# Patient Record
Sex: Female | Born: 1970 | Race: White | Hispanic: No | Marital: Married | Smoking: Current every day smoker
Health system: Southern US, Community
[De-identification: ages and names within clinical notes are randomized; demographics above are authoritative.]

## PROBLEM LIST (undated history)

## (undated) DIAGNOSIS — Z803 Family history of malignant neoplasm of breast: Secondary | ICD-10-CM

## (undated) DIAGNOSIS — F419 Anxiety disorder, unspecified: Secondary | ICD-10-CM

## (undated) DIAGNOSIS — Z808 Family history of malignant neoplasm of other organs or systems: Secondary | ICD-10-CM

## (undated) DIAGNOSIS — C50912 Malignant neoplasm of unspecified site of left female breast: Secondary | ICD-10-CM

## (undated) HISTORY — DX: Family history of malignant neoplasm of other organs or systems: Z80.8

## (undated) HISTORY — DX: Family history of malignant neoplasm of breast: Z80.3

## (undated) NOTE — *Deleted (*Deleted)
[facility]  [address] [place], [place] [address] Phone: [phone]  Fax: [phone]   Clinic Day:  04/10/2020  Referring physician: Duanne Limerick, MD  Chief Complaint: Danielle Bishop is a 48 y.o. female with left breast DCIS s/p bilateral mastectomy who is seen for 3 month assessment.  HPI: The patient was last seen in the medical oncology clinic on 01/13/2020. At that time, she was doing well s/p mastectomy.  Tumor board on 01/20/2020 recommended radiation given her positive margins and consideration of endocrine therapy.  The patient had bilateral breast implants placed on 02/14/2020 by Dr. Arita Miss.  The patient had a CT simulation on 04/08/2020 with Dr. Rushie Chestnut. The plan is to deliver a hypofractionated course of treatment over 3 weeks.  During the interim, ***   Past Medical History:  Diagnosis Date  . Anxiety   . Breast cancer, left [facility])    dx 09/ 2020 by bx with atypical ductal hyperplasia/ papillnoma;   s/p  lefft breast excsional bx 05-21-2019 ,  dx DCIS  . Family history of breast cancer   . Family history of cancer of tongue   . Family history of skin cancer     Past Surgical History:  Procedure Laterality Date  . BREAST RECONSTRUCTION WITH PLACEMENT OF TISSUE EXPANDER AND FLEX HD (ACELLULAR HYDRATED DERMIS) Bilateral 01/01/2020   Procedure: BREAST RECONSTRUCTION WITH PLACEMENT OF TISSUE EXPANDER AND FLEX HD (ACELLULAR HYDRATED DERMIS);  Surgeon: Allena Napoleon, MD;  Location: [facility];  Service: Plastics;  Laterality: Bilateral;  . MASTECTOMY W/ SENTINEL NODE BIOPSY Bilateral 01/01/2020   Procedure: BILATERAL NIPPLE SPARING MASTECTOMY WITH LEFT SENTINEL LYMPH NODE BIOPSY;  Surgeon: Almond Lint, MD;  Location: [facility];  Service: General;  Laterality: Bilateral;  PEC BLOCK  . RADIOACTIVE SEED GUIDED EXCISIONAL BREAST BIOPSY Left 05/21/2019   Procedure: LEFT RADIOACTIVE SEED GUIDED EXCISIONAL  BREAST BIOPSY;  Surgeon: Almond Lint, MD;  Location: [facility];  Service: General;  Laterality: Left;  . RE-EXCISION OF BREAST LUMPECTOMY Left 06/19/2019   Procedure: RE-EXCISION OF LEFT BREAST LUMPECTOMY;  Surgeon: Almond Lint, MD;  Location: [facility];  Service: General;  Laterality: Left;  . REMOVAL OF BILATERAL TISSUE EXPANDERS WITH PLACEMENT OF BILATERAL BREAST IMPLANTS Bilateral 02/14/2020   Procedure: REMOVAL OF BILATERAL TISSUE EXPANDERS WITH PLACEMENT OF BILATERAL BREAST IMPLANTS;  Surgeon: Allena Napoleon, MD;  Location: [facility];  Service: Plastics;  Laterality: Bilateral;  90 min    Family History  Problem Relation Age of Onset  . Stroke Mother   . Breast cancer Maternal Grandmother 50  . Skin cancer Paternal Grandmother   . Skin cancer Paternal Grandfather   . Breast cancer Other   . Tongue cancer Other   . Cancer Other        unknown type  . Breast cancer Other 89  . Breast cancer Other     Social History:  reports that she has been smoking cigarettes. She has a 10.00 pack-year smoking history. She has never used smokeless tobacco. She reports current alcohol use. She reports that she does not use drugs. She smoked 1/2 pack/day x 20 years. She is now smoking 2 cigarettes per day and some days she does not smoke at all.She occasionally drinks alcohol. Sge denies any exposure to radiation or toxins. 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 The patient is alone*** today.  Allergies: No  Known Allergies  Current Medications: Current Outpatient Medications  Medication Sig Dispense Refill  . ALPRAZolam (XANAX) 0.25 MG tablet Take 1 tablet (0.25 mg total) by mouth 2 (two) times daily as needed for anxiety. (Patient not taking: Reported on 02/26/2020) 20 tablet 1  . ondansetron (ZOFRAN) 4 MG tablet Take 1 tablet (4 mg total) by mouth every 8 (eight) hours as needed for nausea or vomiting. (Patient not  taking: Reported on 02/26/2020) 20 tablet 0  . sertraline (ZOLOFT) 50 MG tablet TAKE 1 TABLET(50 MG) BY MOUTH DAILY 90 tablet 0   No current facility-administered medications for this visit.    Review of Systems  Constitutional: Negative for chills, diaphoresis, fever, malaise/fatigue and weight loss (up 4 lbs).       Doing well.  HENT: Negative for congestion, ear discharge, ear pain, hearing loss, nosebleeds, sinus pain, sore throat and tinnitus.   Eyes: Negative for blurred vision.  Respiratory: Negative for cough, hemoptysis, sputum production and shortness of breath.   Cardiovascular: Negative for chest pain, palpitations and leg swelling.  Gastrointestinal: Negative for abdominal pain, blood in stool, constipation, diarrhea, heartburn, melena, nausea and vomiting.  Genitourinary: Negative for dysuria, flank pain, frequency, hematuria and urgency.  Musculoskeletal: Negative for back pain, joint pain, myalgias and neck pain.  Skin: Negative for itching and rash.  Neurological: Negative for dizziness, tingling, tremors, sensory change, weakness and headaches.  Endo/Heme/Allergies: Does not bruise/bleed easily.  Psychiatric/Behavioral: Negative for depression and memory loss. The patient is not nervous/anxious and does not have insomnia.   All other systems reviewed and are negative.  Performance status (ECOG): 0  Vitals There were no vitals taken for this visit.   Physical Exam Vitals and nursing note reviewed.  Constitutional:      General: She is not in acute distress.    Appearance: Normal appearance.     Interventions: Face mask in place.  HENT:     Head: Normocephalic and atraumatic.     Comments: Long dark hair.    Mouth/Throat:     Mouth: Mucous membranes are moist.     Pharynx: Oropharynx is clear.  Eyes:     General: No scleral icterus.    Extraocular Movements: Extraocular movements intact.     Conjunctiva/sclera: Conjunctivae normal.     Pupils: Pupils are  equal, round, and reactive to light.  Cardiovascular:     Rate and Rhythm: Normal rate and regular rhythm.     Pulses: Normal pulses.     Heart sounds: Normal heart sounds. No murmur heard.   Pulmonary:     Effort: Pulmonary effort is normal. No respiratory distress.     Breath sounds: Normal breath sounds. No wheezing or rales.  Chest:     Chest wall: No tenderness.     Comments: Ace bandage in place.  S/p bilateral mastectomy with reconstruction. Abdominal:     General: Bowel sounds are normal. There is no distension.     Palpations: Abdomen is soft. There is no mass.     Tenderness: There is no abdominal tenderness. There is no guarding.  Musculoskeletal:        General: No swelling or tenderness. Normal range of motion.     Cervical back: Normal range of motion and neck supple.  Lymphadenopathy:     Head:     Right side of head: No preauricular, posterior auricular or occipital adenopathy.     Left side of head: No preauricular, posterior auricular or occipital adenopathy.  Cervical: No cervical adenopathy.     Upper Body:     Right upper body: No supraclavicular or axillary adenopathy.     Left upper body: No supraclavicular or axillary adenopathy.     Lower Body: No right inguinal adenopathy. No left inguinal adenopathy.  Skin:    General: Skin is warm and dry.  Neurological:     Mental Status: She is alert and oriented to person, place, and time. Mental status is at baseline.  Psychiatric:        Mood and Affect: Mood normal.        Behavior: Behavior normal.        Thought Content: Thought content normal.        Judgment: Judgment normal.    No visits with results within 3 Day(s) from this visit.  Latest known visit with results is:  Admission on 06/19/2019, Discharged on 06/19/2019  Component Date Value Ref Range Status  . Preg Test, Ur 06/19/2019 NEGATIVE  NEGATIVE Final   Comment:        THE SENSITIVITY OF THIS METHODOLOGY IS >24 mIU/mL   . SURGICAL  PATHOLOGY 06/19/2019    Final-Edited                   Value:SURGICAL PATHOLOGY CASE: [id number] PATIENT: Danielle Bishop Surgical Pathology Report  Clinical History: Left breast cancer (cm)  FINAL MICROSCOPIC DIAGNOSIS:  A. BREAST, LEFT POSTERIOR MARGIN, RE-EXCISION: - Focus of residual ductal carcinoma in situ. - Atypical lobular hyperplasia/adenosis. - Please see comment.  B. BREAST, LEFT INFERIOR MARGIN, RE-EXCISION: - Residual ductal carcinoma in situ. - Please see comment.  COMMENT:  A. The focus of residual DCIS is present at the cauterized black ink. CK5/6 is negative in DCIS.              E-cadherin demonstrates weak staining in atypical lobular hyperplasia.  P63, Calponin and SMM-1 demonstrate the presence of myoepithelium throughout.  B.  Focally, residual DCIS is present at the cauterized black ink. CK5/6 is negative in DCIS.  Intradepartmental consultation (Dr. Berneice Heinrich).  GROSS DESCRIPTION:  A. Received fresh and placed in formalin at 12 PM is a 3 x 2.6 x 1.5 cm portion of tan-yellow soft tiss                         ue, received entirely inked black. Sectioned and entirely submitted in 5 cassettes.  B.  Received fresh and placed in formalin at 12 PM is a 3.1 x 1.5 x 1 cm portion of tan-yellow soft tissue, received entirely inked blue. Sectioned and entirely submitted in 5 cassettes.  (AK 06/19/2019)  Final Diagnosis performed by Consuello Bossier, MD.   Electronically signed 06/24/2019 Technical and / or Professional components performed at [facility]. [facility], [address] [place], [place] [address].  Immunohistochemistry Technical component (if applicable) was performed at [facility]. [address], [place], [place] [address].   IMMUNOHISTOCHEMISTRY DISCLAIMER (if applicable): Some of these immunohistochemical stains may have been developed and the performance characteristics determine by [facility].  Some may not have been cleared or approved by the U.S. Food and Drug Administration. The FDA has determined that such clear                         ance or approval is not necessary. This test is used for clinical purposes. It should not be  regarded as investigational or for research. This laboratory is certified under the Clinical Laboratory Improvement Amendments of 1988 (CLIA-88) as qualified to perform high complexity clinical laboratory testing.  The controls stained appropriately.    Assessment:  Danielle Bishop is a 27 y.o. female  with left breast DCISs/p bilateral nipple sparing mastectomy and reconstruction on 01/01/2020.  Pathology revealed a 6.5 cm area of grade II DCIS, focally involving the anterior margin and <1 mm from the posterior margin.  One sentinel lymph node was negative.  DCIS was ER+ (95%) and PR+ (100%).  Pathologic stage was pTis (DCIS) pN0.  Right breast pathology was benign.  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  Bilateral mammogram with left axillary ultrasoundon 02/14/2019 revealed duct ectasia in the 6-7 o'clock location of the LEFT breast without identifiable intraductal mass. There was a solid 0.7 x 0.5 x 0.7 cm mass in the 7 o'clock location 2 cm from the nipple in the LEFT breast. There was an enlarged LEFT axillary lymph node. There was a single duct spontaneous LEFT nipple discharge.  Bilateral breast MRI on 07/16/2019 revealed postoperative seroma in the central inferior left breast at the site of patient's lumpectomy for DCIS. Non-mass enhancement was seen surrounding the lumpectomy site involving the entire inferior left breast, spanning 4.0 x 5.8 x 4.3 cm. There was suspicious linear enhancement extending from the postoperative seroma into the nipple. There was indeterminate patchy non-mass enhancement extending into the upper inner  quadrant(UIQ)of the left breast (1.3 x 1.2 cm). There were two indeterminate enhancing masses in the upper inner and lower outer right breast. There was no suspicious lymphadenopathy.  Left breast biopsy on02/12/2021revealedfibrocystic changes in the upper inner quadrant(UIQ)and intermediate grade DCISin thelower outer quadrant (LOQ).DCIS wasER+ (95%)and PR+ (100%).  Right breast biopsyon 02/15/2021revealedatypical lobular hyperplasia, findings suggestive of lipoma,and fibrocystic changes in lower outer quadrant(LOQ).  She has a family historyof breast cancer (maternal grandmother age 30).  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  Patient received the Moderna COVID-19 vaccine on 08/16/2019 and 09/18/2019.   Symptomatically, ***  Plan: 1.   Labs today:  CBC with diff, CMP.  2.   Left breast DCIS Initial pathology revealedgrade II DCIS with focally positive margin. Re-excisionpathology revealed persistent + margins (posterior and inferior). She is s/p bilateral mastectomy and reconstruction on 01/01/2020.  Pathology revealed a 6.5 cm area of grade 2 DCIS focally involving the anterior margin and < 1 mm from the posterior margin.  Discussed likely plan for radiation given positive margin.  Discuss rationale for endocrine therapy for stage 0 breast cancer:   NSABP B-24:  1800 patients randomized between tamoxifen vs placebo in patients s/p BCT and radiation.      At 13.6 years, patients who received tamoxifen had a 3.4% reduction on ipsilateral recurrence and a 3.2% reduction in contralateral breast cancer.      10 year risk of invasive cancer in the ipsilateral breast was 4.6% in the tamoxifen group vs 7.3% placebo group.    10 year risk of 5.6% non-invasive ipsilateral breast cancer was 5.6% in the  tamoxifen group vs 7.2% in the placebo group.      10 year risk in the contralateral breast of invasive and non-invasive breast cancer was 4.7% in the tamoxifen group vs 6.9% in the placebo group.  As patient is s/p bilateral mastectomy, discuss limited benefit of adjuvant endocrine therapy.  Present patient at tumor board. 2. Family history of breast cancer Patient 74 with family history of breast cancer in a maternal grandmother at age 48. [employer] results revealed a variant of uncertain significance.   ATM c.7816A>G (p.lle2606Val) and gain (exons 62-63). 3.   Tumor board on 01/20/2020. 4.   MD to call patient after tumor board on 01/20/2020. 5.   RTC in 3 months for MD assessment.  I discussed the assessment and treatment plan with the patient.  The patient was provided an opportunity to ask questions and all were answered.  The patient agreed with the plan and demonstrated an understanding of the instructions.  The patient was advised to call back if the symptoms worsen or if the condition fails to improve as anticipated.  I provided *** minutes of face-to-face time during this this encounter and > 50% was spent counseling as documented under my assessment and plan.  Rosey Bath, MD, PhD    04/10/2020, 10:49 AM  I, Danella Penton Tufford, am acting as Neurosurgeon for [employer]. Merlene Pulling, MD, PhD.  I, Melissa C. Merlene Pulling, MD, have reviewed the above documentation for accuracy and completeness, and I agree with the above.

---

## 2017-04-17 ENCOUNTER — Ambulatory Visit: Payer: 59 | Admitting: Family Medicine

## 2017-04-17 ENCOUNTER — Encounter: Payer: Self-pay | Admitting: Family Medicine

## 2017-04-17 VITALS — BP 120/72 | HR 86 | Ht 62.0 in | Wt 118.0 lb

## 2017-04-17 DIAGNOSIS — Z7689 Persons encountering health services in other specified circumstances: Secondary | ICD-10-CM

## 2017-04-17 DIAGNOSIS — Z720 Tobacco use: Secondary | ICD-10-CM

## 2017-04-17 DIAGNOSIS — Z23 Encounter for immunization: Secondary | ICD-10-CM | POA: Diagnosis not present

## 2017-04-17 MED ORDER — VARENICLINE TARTRATE 0.5 MG PO TABS: 0.5000 mg | ORAL_TABLET | Freq: Two times a day (BID) | ORAL | 0 refills | Status: DC

## 2017-04-17 NOTE — Progress Notes (Signed)
Name: Danielle Bishop   MRN: [id number]    DOB: 1971/06/07   Date:04/17/2017       Progress Note  Subjective  Chief Complaint  Chief Complaint  Patient presents with  . Establish Care    needed PCP    Patient to establish primary care.    No problem-specific Assessment & Plan notes found for this encounter.   History reviewed. No pertinent past medical history.  History reviewed. No pertinent surgical history.  Family History  Problem Relation Age of Onset  . Stroke Mother   . Cancer Maternal Grandmother     Social History   Socioeconomic History  . Marital status: Married    Spouse name: Not on file  . Number of children: Not on file  . Years of education: Not on file  . Highest education level: Not on file  Social Needs  . Financial resource strain: Not on file  . Food insecurity - worry: Not on file  . Food insecurity - inability: Not on file  . Transportation needs - medical: Not on file  . Transportation needs - non-medical: Not on file  Occupational History  . Not on file  Tobacco Use  . Smoking status: Current Every Day Smoker    Types: Cigarettes  . Smokeless tobacco: Never Used  . Tobacco comment: patches and oral meds given info  Substance and Sexual Activity  . Alcohol use: Yes  . Drug use: No  . Sexual activity: Yes  Other Topics Concern  . Not on file  Social History Narrative  . Not on file    No Known Allergies  No outpatient medications prior to visit.   No facility-administered medications prior to visit.     Review of Systems  Constitutional: Negative for chills, diaphoresis, fever, malaise/fatigue and weight loss.  HENT: Negative for congestion, ear discharge, ear pain, hearing loss, nosebleeds, sore throat and tinnitus.   Eyes: Negative for blurred vision, double vision, pain and discharge.  Respiratory: Negative for cough, hemoptysis, sputum production, shortness of breath, wheezing and stridor.   Cardiovascular: Negative for chest  pain, palpitations, orthopnea, leg swelling and PND.  Gastrointestinal: Negative for abdominal pain, blood in stool, constipation, diarrhea, heartburn, melena, nausea and vomiting.  Genitourinary: Negative for dysuria, flank pain, frequency, hematuria and urgency.  Musculoskeletal: Negative for back pain, falls, joint pain, myalgias and neck pain.  Skin: Negative for itching and rash.  Neurological: Negative for dizziness, tingling, sensory change, focal weakness, seizures, loss of consciousness, weakness and headaches.  Endo/Heme/Allergies: Negative for environmental allergies and polydipsia. Does not bruise/bleed easily.  Psychiatric/Behavioral: Negative for depression and suicidal ideas. The patient is nervous/anxious. The patient does not have insomnia.      Objective  Vitals:   04/17/17 1439  BP: 120/72  Pulse: 86  Weight: 118 lb (53.5 kg)  Height: 5\' 2"  (1.575 m)    Physical Exam  Constitutional: She is well-developed, well-nourished, and in no distress. No distress.  HENT:  Head: Normocephalic and atraumatic.  Right Ear: External ear normal.  Left Ear: External ear normal.  Nose: Nose normal.  Mouth/Throat: Oropharynx is clear and moist.  Eyes: Conjunctivae and EOM are normal. Pupils are equal, round, and reactive to light. Right eye exhibits no discharge. Left eye exhibits no discharge.  Neck: Normal range of motion. Neck supple. No JVD present. No thyromegaly present.  Cardiovascular: Normal rate, regular rhythm, normal heart sounds and intact distal pulses. Exam reveals no gallop and no friction rub.  No  murmur heard. Pulmonary/Chest: Effort normal and breath sounds normal. She has no wheezes. She has no rales.  Abdominal: Soft. Bowel sounds are normal. She exhibits no mass. There is no tenderness. There is no guarding.  Musculoskeletal: Normal range of motion. She exhibits no edema.  Lymphadenopathy:    She has no cervical adenopathy.  Neurological: She is alert. She  has normal reflexes.  Skin: Skin is warm and dry. She is not diaphoretic.  Psychiatric: Mood and affect normal.  Nursing note and vitals reviewed.     Assessment & Plan  Problem List Items Addressed This Visit    None    Visit Diagnoses    Establishing care with new doctor, encounter for    -  Primary   Current nicotine use       Influenza vaccine needed       Relevant Orders   Flu Vaccine QUAD 36+ mos IM (Completed)      Meds ordered this encounter  Medications  . varenicline (CHANTIX) 0.5 MG tablet    Sig: Take 1 tablet (0.5 mg total) 2 (two) times daily by mouth.    Dispense:  60 tablet    Refill:  0      Dr. Otilio Miu [facility]  04/17/17

## 2019-02-07 ENCOUNTER — Ambulatory Visit: Payer: 59 | Admitting: Family Medicine

## 2019-02-07 ENCOUNTER — Encounter: Payer: Self-pay | Admitting: Family Medicine

## 2019-02-07 ENCOUNTER — Other Ambulatory Visit: Payer: Self-pay

## 2019-02-07 VITALS — BP 122/68 | HR 80 | Ht 68.0 in | Wt 122.0 lb

## 2019-02-07 DIAGNOSIS — N6452 Nipple discharge: Secondary | ICD-10-CM | POA: Diagnosis not present

## 2019-02-07 DIAGNOSIS — N6012 Diffuse cystic mastopathy of left breast: Secondary | ICD-10-CM | POA: Diagnosis not present

## 2019-02-07 DIAGNOSIS — Z23 Encounter for immunization: Secondary | ICD-10-CM | POA: Diagnosis not present

## 2019-02-07 DIAGNOSIS — N6011 Diffuse cystic mastopathy of right breast: Secondary | ICD-10-CM | POA: Diagnosis not present

## 2019-02-07 NOTE — Patient Instructions (Signed)
Fibrocystic Breast Changes  Fibrocystic breast changes are changes in breast tissue that can cause breasts to become swollen, lumpy, or painful. This can happen due to buildup of scar-like tissue (fibrous tissue) or the forming of fluid-filled lumps (cysts) in the breast. This is a common condition, and it is not cancerous (is benign). The exact cause is not known, but it seems to occur when women go through hormonal changes during their menstrual cycle. Fibrocystic breast changes can affect one or both breasts. What are the causes? The exact cause of fibrocystic breast changes is not known. However, this condition:  May be related to the female hormones estrogen and progesterone.  May be influenced by family traits that get passed from parent to child (genetics). What are the signs or symptoms? Symptoms of this condition may affect one or both breasts, and may include:  Tenderness, mild discomfort, or pain.  Swelling.  Rope-like tissue that can be felt when touching the breast.  Lumps in one or both breasts.  Changes in breast size. Breasts may get larger before the menstrual period and smaller after the menstrual period.  Green or dark brown discharge from the nipple. Symptoms are usually worse before menstrual periods start, and they get better toward the end of menstrual periods. How is this diagnosed? This condition is diagnosed based on your medical history and a physical exam of your breasts. You may also have tests, such as:  A breast X-ray (mammogram).  Ultrasound of your breasts.  MRI.  Removal of a breast tissue sample for testing (breast biopsy). This may be done if your health care provider thinks that something else may be causing changes in your breasts. How is this treated? Often, treatment is not needed for this condition. In some cases, treatment may include:  Taking over-the-counter pain relievers to help lessen pain or discomfort.  Limiting or avoiding  caffeine. Foods and beverages that contain caffeine include chocolate, soda, coffee, and tea.  Reducing sugar and fat in your diet. Your health care provider may also recommend:  A procedure to remove fluid from a cyst that is causing pain (fine needle aspiration).  Surgery to remove a cyst that is large or tender or does not go away. Follow these instructions at home:  Examine your breasts after every menstrual period. If you do not have menstrual periods, check your breasts on the first day of every month. Feel for changes in your breasts, such as: ? More tenderness. ? A new growth. ? A change in size. ? A change in an existing lump.  Take over-the-counter and prescription medicines only as told by your health care provider.  Wear a well-fitted support or sports bra, especially when exercising.  Decrease or avoid caffeine, fat, and sugar in your diet as directed by your health care provider. Contact a health care provider if:  You have fluid leaking from your nipple, especially if it is bloody.  You have new lumps or bumps in your breast.  Your breast becomes enlarged, red, and painful.  You have areas of your breast that pucker inward.  Your nipple appears flat or indented. Get help right away if:  You have redness of your breast and the redness is spreading. Summary  Fibrocystic breast changes are changes in breast tissue that can cause breasts to become swollen, lumpy, or painful.  This condition may be related to the female hormones estrogen and progesterone.  With this condition, it is important to examine your breasts after every   menstrual period. If you do not have menstrual periods, check your breasts on the first day of every month. This information is not intended to replace advice given to you by your health care provider. Make sure you discuss any questions you have with your health care provider. Document Released: 03/16/2006 Document Revised: 05/12/2017  Document Reviewed: 01/27/2016 Elsevier Patient Education  2020 [employer].

## 2019-02-07 NOTE — Progress Notes (Signed)
Date:  02/07/2019   Name:  Danielle Bishop   DOB:  1970/08/30   MRN:  [id number]   Chief Complaint: Breast Discharge (brownish discharge x 8 months- 1 year L) breast) and influenza vacc need  Patient is a 48 year old female who presents for a breast  exam. The patient reports the following problems: nipple discharge. Health maintenance has been reviewed up to date.   Review of Systems  Constitutional: Negative.  Negative for chills, fatigue, fever and unexpected weight change.  HENT: Negative for congestion, ear discharge, ear pain, postnasal drip, rhinorrhea, sinus pressure, sneezing and sore throat.   Eyes: Negative for photophobia, pain, discharge, redness and itching.  Respiratory: Negative for cough, shortness of breath, wheezing and stridor.   Cardiovascular: Negative for chest pain, palpitations and leg swelling.  Gastrointestinal: Negative for abdominal pain, blood in stool, constipation, diarrhea, nausea and vomiting.  Endocrine: Negative for cold intolerance, heat intolerance, polydipsia, polyphagia and polyuria.  Genitourinary: Negative for dysuria, flank pain, frequency, hematuria, menstrual problem, pelvic pain, urgency, vaginal bleeding and vaginal discharge.  Musculoskeletal: Negative for arthralgias, back pain and myalgias.  Skin: Negative for rash.  Allergic/Immunologic: Negative for environmental allergies and food allergies.  Neurological: Negative for dizziness, weakness, light-headedness, numbness and headaches.  Hematological: Negative for adenopathy. Does not bruise/bleed easily.  Psychiatric/Behavioral: Negative for dysphoric mood. The patient is not nervous/anxious.     There are no active problems to display for this patient.   No Known Allergies  No past surgical history on file.  Social History   Tobacco Use  . Smoking status: Current Every Day Smoker    Types: Cigarettes  . Smokeless tobacco: Never Used  . Tobacco comment: patches and oral meds given  info  Substance Use Topics  . Alcohol use: Yes  . Drug use: No     Medication list has been reviewed and updated.  No outpatient medications have been marked as taking for the 02/07/19 encounter (Office Visit) with Juline Patch, MD.    [facility] 2/9 Scores 04/17/2017  PHQ - 2 Score 0  PHQ- 9 Score 1    BP Readings from Last 3 Encounters:  02/07/19 122/68  04/17/17 120/72    Physical Exam Vitals signs and nursing note reviewed.  Constitutional:      Appearance: She is well-developed and normal weight.  HENT:     Head: Normocephalic.     Right Ear: Tympanic membrane, ear canal and external ear normal.     Left Ear: Tympanic membrane, ear canal and external ear normal.     Nose: Nose normal.  Eyes:     General: Lids are everted, no foreign bodies appreciated. No scleral icterus.       Left eye: No foreign body or hordeolum.     Conjunctiva/sclera: Conjunctivae normal.     Right eye: Right conjunctiva is not injected.     Left eye: Left conjunctiva is not injected.     Pupils: Pupils are equal, round, and reactive to light.  Neck:     Musculoskeletal: Normal range of motion and neck supple.     Thyroid: No thyromegaly.     Vascular: No JVD.     Trachea: No tracheal deviation.  Cardiovascular:     Rate and Rhythm: Normal rate and regular rhythm.     Heart sounds: Normal heart sounds. No murmur. No friction rub. No gallop.   Pulmonary:     Effort: Pulmonary effort is normal. No respiratory distress.  Breath sounds: Normal breath sounds. No wheezing or rales.  Chest:     Breasts: Breasts are symmetrical.        Right: No swelling, bleeding, inverted nipple, mass, nipple discharge, skin change or tenderness.        Left: Nipple discharge present. No swelling, bleeding, inverted nipple, mass, skin change or tenderness.     Comments: Breast tissue c/w fibrocystic disease Abdominal:     General: Bowel sounds are normal.     Palpations: Abdomen is soft. There is no mass.      Tenderness: There is no abdominal tenderness. There is no guarding or rebound.  Musculoskeletal: Normal range of motion.        General: No tenderness.  Lymphadenopathy:     Cervical: No cervical adenopathy.  Skin:    General: Skin is warm.     Findings: No rash.  Neurological:     Mental Status: She is alert and oriented to person, place, and time.     Cranial Nerves: No cranial nerve deficit.     Deep Tendon Reflexes: Reflexes normal.  Psychiatric:        Mood and Affect: Mood is not anxious or depressed.     Wt Readings from Last 3 Encounters:  02/07/19 122 lb (55.3 kg)  04/17/17 118 lb (53.5 kg)    BP 122/68   Pulse 80   Ht 5\' 8"  (1.727 m)   Wt 122 lb (55.3 kg)   LMP 02/07/2019 (Exact Date)   BMI 18.55 kg/m   Assessment and Plan:  1. Discharge from left nipple Patient has noted a brownish discharge for the past several months from the left nipple.  Will schedule for mammogram with associated tomos and ultrasound as needed. - MM DIAG BREAST TOMO BILATERAL; Future - US BREAST LTD UNI LEFT INC AXILLA; Future - US BREAST LTD UNI RIGHT INC AXILLA; Future  2. Fibrocystic breast changes of both breasts Patient has an exam consistent with fibrocystic changes in both breasts and this would be noted for future. - MM DIAG BREAST TOMO BILATERAL; Future  3. Influenza vaccine needed Discussed and administered. - Flu Vaccine QUAD 6+ mos PF IM (Fluarix Quad PF)

## 2019-02-14 ENCOUNTER — Ambulatory Visit
Admission: RE | Admit: 2019-02-14 | Discharge: 2019-02-14 | Disposition: A | Discharge status: Home / Self Care | Payer: 59 | Source: Ambulatory Visit | Attending: Family Medicine | Admitting: Family Medicine

## 2019-02-14 ENCOUNTER — Other Ambulatory Visit: Payer: Self-pay | Admitting: Family Medicine

## 2019-02-14 DIAGNOSIS — N6011 Diffuse cystic mastopathy of right breast: Secondary | ICD-10-CM | POA: Diagnosis present

## 2019-02-14 DIAGNOSIS — N6452 Nipple discharge: Secondary | ICD-10-CM

## 2019-02-14 DIAGNOSIS — N6320 Unspecified lump in the left breast, unspecified quadrant: Secondary | ICD-10-CM

## 2019-02-14 DIAGNOSIS — R928 Other abnormal and inconclusive findings on diagnostic imaging of breast: Secondary | ICD-10-CM

## 2019-02-14 DIAGNOSIS — N6012 Diffuse cystic mastopathy of left breast: Secondary | ICD-10-CM | POA: Diagnosis present

## 2019-02-22 ENCOUNTER — Ambulatory Visit
Admission: RE | Admit: 2019-02-22 | Discharge: 2019-02-22 | Disposition: A | Discharge status: Home / Self Care | Payer: 59 | Source: Ambulatory Visit | Attending: Family Medicine | Admitting: Family Medicine

## 2019-02-22 DIAGNOSIS — R928 Other abnormal and inconclusive findings on diagnostic imaging of breast: Secondary | ICD-10-CM | POA: Diagnosis not present

## 2019-02-22 DIAGNOSIS — N6320 Unspecified lump in the left breast, unspecified quadrant: Secondary | ICD-10-CM

## 2019-02-25 LAB — SURGICAL PATHOLOGY

## 2019-03-18 ENCOUNTER — Other Ambulatory Visit: Payer: Self-pay | Admitting: General Surgery

## 2019-03-18 DIAGNOSIS — N6452 Nipple discharge: Secondary | ICD-10-CM

## 2019-04-10 ENCOUNTER — Other Ambulatory Visit: Payer: Self-pay | Admitting: General Surgery

## 2019-04-10 DIAGNOSIS — N6452 Nipple discharge: Secondary | ICD-10-CM

## 2019-05-07 ENCOUNTER — Encounter (HOSPITAL_BASED_OUTPATIENT_CLINIC_OR_DEPARTMENT_OTHER): Payer: Self-pay | Admitting: *Deleted

## 2019-05-07 ENCOUNTER — Other Ambulatory Visit: Payer: Self-pay

## 2019-05-17 ENCOUNTER — Other Ambulatory Visit
Admission: RE | Admit: 2019-05-17 | Discharge: 2019-05-17 | Disposition: A | Discharge status: Home / Self Care | Payer: 59 | Source: Ambulatory Visit | Attending: General Surgery | Admitting: General Surgery

## 2019-05-17 ENCOUNTER — Other Ambulatory Visit (HOSPITAL_COMMUNITY): Payer: 59

## 2019-05-17 DIAGNOSIS — Z20828 Contact with and (suspected) exposure to other viral communicable diseases: Secondary | ICD-10-CM | POA: Diagnosis not present

## 2019-05-17 DIAGNOSIS — Z01812 Encounter for preprocedural laboratory examination: Secondary | ICD-10-CM | POA: Insufficient documentation

## 2019-05-17 LAB — SARS CORONAVIRUS 2 (TAT 6-24 HRS): SARS Coronavirus 2: NEGATIVE

## 2019-05-20 ENCOUNTER — Other Ambulatory Visit: Payer: Self-pay

## 2019-05-20 ENCOUNTER — Ambulatory Visit
Admission: RE | Admit: 2019-05-20 | Discharge: 2019-05-20 | Disposition: A | Discharge status: Home / Self Care | Payer: 59 | Source: Ambulatory Visit | Attending: General Surgery | Admitting: General Surgery

## 2019-05-20 DIAGNOSIS — N6452 Nipple discharge: Secondary | ICD-10-CM

## 2019-05-20 NOTE — H&P (View-Only) (Signed)
Danielle Bishop Location: [place] Office Patient #: [phone] DOB: Oct 17, 1970 Undefined / Language: [name] / Race: White Female   History of Present Illness The patient is a 48 year old female who presents with a complaint of nipple discharge. Danielle Bishop is a 48 yo F referred for consultation at the request of Dr. Otilio Miu for a diagnosis of nipple discharge starting approximately in June of 2020. She noticed this because of occasional brown spots on her shirts/bras. She denies breast pain. She has always had dense breasts. She has family history of breast cancer in her maternal grandmother who was diagnosed around age 55. She underwent diagnostic imaging which showed numerous dilated ducts on the left behind the areola and a 7 mm mass at 7 o'clock around 2 cm from the nipple. 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 she presents to discuss excision. A left sided lymph node was also biopsied and was benign.   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   dx mammogram/us 02/14/2019 COMPARISON: Baseline exam  ACR Breast Density Category d: The breast tissue is extremely dense, which lowers the sensitivity of mammography.  FINDINGS: RIGHT breast is negative.  Asymmetric density is identified in the LOWER INNER QUADRANT of the LEFT breast and further evaluated with spot compression views. These views demonstrate no discrete mass in the [place]. Spot compression view of the retroareolar region of the LEFT breast is unremarkable.  Mammographic images were processed with CAD.  On physical exam, I am able to express a small amount of clear discharge from the central aspect of the LEFT nipple, a single duct. I palpate no abnormality in the LOWER INNER QUADRANT of the LEFT breast.  Targeted ultrasound is performed, showing numerous dilated ducts in the 6-7 o'clock location of the LEFT breast.  No intraductal mass identified. A solid mass with cystic components is identified in the 7 o'clock location of the LEFT breast 2 centimeters from the nipple measuring 0.7 x 0.5 x 0.7 centimeters.  Evaluation of the LEFT axilla shows a single lymph node with thickened cortex. Other lymph nodes have normal morphology.  IMPRESSION: Duct ectasia in the 6-7 o'clock location of the LEFT breast without identifiable intraductal mass.  Solid mass in the 7 o'clock location of the LEFT breast warranting tissue diagnosis.  Enlarged LEFT axillary lymph node warranting biopsy.  Single duct spontaneous LEFT nipple discharge warrants further evaluation.  RECOMMENDATION: 1. Ultrasound-guided core biopsy of mass in the 7 o'clock location of the LEFT breast. 2. Ultrasound-guided core biopsy of enlarged LEFT axillary lymph node. 3. Further evaluation with MRI and surgical consultation will be recommended following biopsies to evaluate single duct spontaneous discharge.  I have discussed the findings and recommendations with the patient. If applicable, a reminder letter will be sent to the patient regarding the next appointment.  BI-RADS CATEGORY 4: Suspicious.   pathology biopsy 02/22/2019   DIAGNOSIS: A. 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  Comment: No invasive carcinoma is seen in this sample.  B. LYMPH NODE, LEFT AXILLA; ULTRASOUND-GUIDED CORE BIOPSY: - LYMPH NODE WITH TATTOO PIGMENT. - NEGATIVE FOR MALIGNANCY.   Past Surgical History Breast Biopsy  Left.  Diagnostic Studies History Colonoscopy  never Mammogram  within last year Pap Smear  >5 years ago  Allergies  No Known Drug Allergies [03/18/2019]:  Medication History No Current Medications Medications Reconciled  Pregnancy / Birth History Age at menarche  50  years. Contraceptive History  Oral contraceptives. Gravida  0 Regular periods   Other Problems No pertinent past medical history     Review of Systems General Present- Night Sweats. Not Present- Appetite Loss, Chills, Fatigue, Fever, Weight Gain and Weight Loss. Skin Not Present- Change in Wart/Mole, Dryness, Hives, Jaundice, New Lesions, Non-Healing Wounds, Rash and Ulcer. HEENT Not Present- Earache, Hearing Loss, Hoarseness, Nose Bleed, Oral Ulcers, Ringing in the Ears, Seasonal Allergies, Sinus Pain, Sore Throat, Visual Disturbances, Wears glasses/contact lenses and Yellow Eyes. Respiratory Present- Snoring. Not Present- Bloody sputum, Chronic Cough, Difficulty Breathing and Wheezing. Breast Present- Nipple Discharge. Not Present- Breast Mass, Breast Pain and Skin Changes. Cardiovascular Not Present- Chest Pain, Difficulty Breathing Lying Down, Leg Cramps, Palpitations, Rapid Heart Rate, Shortness of Breath and Swelling of Extremities. Gastrointestinal Not Present- Abdominal Pain, Bloating, Bloody Stool, Change in Bowel Habits, Chronic diarrhea, Constipation, Difficulty Swallowing, Excessive gas, Gets full quickly at meals, Hemorrhoids, Indigestion, Nausea, Rectal Pain and Vomiting. Female Genitourinary Not Present- Frequency, Nocturia, Painful Urination, Pelvic Pain and Urgency. Musculoskeletal Not Present- Back Pain, Joint Pain, Joint Stiffness, Muscle Pain, Muscle Weakness and Swelling of Extremities. Neurological Not Present- Decreased Memory, Fainting, Headaches, Numbness, Seizures, Tingling, Tremor, Trouble walking and Weakness. Psychiatric Present- Anxiety. Not Present- Bipolar, Change in Sleep Pattern, Depression, Fearful and Frequent crying. Endocrine Not Present- Cold Intolerance, Excessive Hunger, Hair Changes, Heat Intolerance, Hot flashes and New Diabetes. Hematology Not Present- Blood Thinners, Easy Bruising, Excessive bleeding, Gland problems, HIV and Persistent  Infections.  Vitals Weight: 118.25 lb Height: 62.5in Body Surface Area: 1.54 m Body Mass Index: 21.28 kg/m  Temp.: 98.28F(Oral)  Pulse: 101 (Regular)  P.OX: 96% (Room air)       Physical Exam General Mental Status-Alert. General Appearance-Consistent with stated age. Hydration-Well hydrated. Voice-Normal.  Head and Neck Head-normocephalic, atraumatic with no lesions or palpable masses. Trachea-midline. Thyroid Gland Characteristics - normal size and consistency.  Eye Eyeball - Bilateral-Extraocular movements intact. Sclera/Conjunctiva - Bilateral-No scleral icterus.  Chest and Lung Exam Chest and lung exam reveals -quiet, even and easy respiratory effort with no use of accessory muscles and on auscultation, normal breath sounds, no adventitious sounds and normal vocal resonance. Inspection Chest Wall - Normal. Back - normal.  Breast Note: breasts are symmetric bilaterally. no palpable masses. breasts are quite dense. minimal ptosis. bloody nipple discharge is present nearly centrally on the right nipple, maybe slightly lower outer portion. no LAD.   Cardiovascular Cardiovascular examination reveals -normal heart sounds, regular rate and rhythm with no murmurs and normal pedal pulses bilaterally.  Abdomen Inspection Inspection of the abdomen reveals - No Hernias. Palpation/Percussion Palpation and Percussion of the abdomen reveal - Soft, Non Tender, No Rebound tenderness, No Rigidity (guarding) and No hepatosplenomegaly. Auscultation Auscultation of the abdomen reveals - Bowel sounds normal.  Neurologic Neurologic evaluation reveals -alert and oriented x 3 with no impairment of recent or remote memory. Mental Status-Normal.  Musculoskeletal Global Assessment -Note: no gross deformities.  Normal Exam - Left-Upper Extremity Strength Normal and Lower Extremity Strength Normal. Normal Exam - Right-Upper Extremity  Strength Normal and Lower Extremity Strength Normal.  Lymphatic Head & Neck  General Head & Neck Lymphatics: Bilateral - Description - Normal. Axillary  General Axillary Region: Bilateral - Description - Normal. Tenderness - Non Tender. Femoral & Inguinal  Generalized Femoral & Inguinal Lymphatics: Bilateral - Description - No Generalized lymphadenopathy.  Assessment & Plan BLOODY DISCHARGE FROM LEFT NIPPLE (N64.52) Impression: Pt has an intraductal papilloma with bloody nipple discharge and ADH. Will plan seed localized excisional breast biopsy with major duct excision.  The surgical procedure was described to the patient. I discussed the incision type and location and that we would need radiology involved on with a wire or seed marker and/or sentinel node.  The risks and benefits of the procedure were described to the patient and she wishes to proceed.  We discussed the risks bleeding, infection, damage to other structures, need for further procedures/surgeries. We discussed the risk of seroma. The patient was advised if the area in the breast in cancer, we may need to go back to surgery for additional tissue to obtain negative margins or for a lymph node biopsy. The patient was advised that these are the most common complications, but that others can occur as well. They were advised against taking aspirin or other anti-inflammatory agents/blood thinners the week before surgery. Current Plans You are being scheduled for surgery- Our schedulers will call you.  You should hear from our office's scheduling department within 5 working days about the location, date, and time of surgery. We try to make accommodations for patient's preferences in scheduling surgery, but sometimes the OR schedule or the surgeon's schedule prevents [place] from making those accommodations.  If you have not heard from our office [phone] in 5 working days, call the office and ask for your surgeon's  nurse.  If you have other questions about your diagnosis, plan, or surgery, call the office and ask for your surgeon's nurse.  Pt Education - [facility] Breast Biopsy HCI: discussed with patient and provided information.

## 2019-05-20 NOTE — H&P (Signed)
Danielle Bishop Location: [place] Office Patient #: [id number] DOB: 1970/09/15 Undefined / Language: [name] / Race: White Female   History of Present Illness The patient is a 48 year old female who presents with a complaint of nipple discharge. Danielle Bishop is a 48 yo F referred for consultation at the request of Dr. Otilio Miu for a diagnosis of nipple discharge starting approximately in June of 2020. She noticed this because of occasional brown spots on her shirts/bras. She denies breast pain. She has always had dense breasts. She has family history of breast cancer in her maternal grandmother who was diagnosed around age 12. She underwent diagnostic imaging which showed numerous dilated ducts on the left behind the areola and a 7 mm mass at 7 o'clock around 2 cm from the nipple. 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 she presents to discuss excision. A left sided lymph node was also biopsied and was benign.   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   dx mammogram/us 02/14/2019 COMPARISON: Baseline exam  ACR Breast Density Category d: The breast tissue is extremely dense, which lowers the sensitivity of mammography.  FINDINGS: RIGHT breast is negative.  Asymmetric density is identified in the LOWER INNER QUADRANT of the LEFT breast and further evaluated with spot compression views. These views demonstrate no discrete mass in the [place]. Spot compression view of the retroareolar region of the LEFT breast is unremarkable.  Mammographic images were processed with CAD.  On physical exam, I am able to express a small amount of clear discharge from the central aspect of the LEFT nipple, a single duct. I palpate no abnormality in the LOWER INNER QUADRANT of the LEFT breast.  Targeted ultrasound is performed, showing numerous dilated ducts in the 6-7 o'clock location of the LEFT breast.  No intraductal mass identified. A solid mass with cystic components is identified in the 7 o'clock location of the LEFT breast 2 centimeters from the nipple measuring 0.7 x 0.5 x 0.7 centimeters.  Evaluation of the LEFT axilla shows a single lymph node with thickened cortex. Other lymph nodes have normal morphology.  IMPRESSION: Duct ectasia in the 6-7 o'clock location of the LEFT breast without identifiable intraductal mass.  Solid mass in the 7 o'clock location of the LEFT breast warranting tissue diagnosis.  Enlarged LEFT axillary lymph node warranting biopsy.  Single duct spontaneous LEFT nipple discharge warrants further evaluation.  RECOMMENDATION: 1. Ultrasound-guided core biopsy of mass in the 7 o'clock location of the LEFT breast. 2. Ultrasound-guided core biopsy of enlarged LEFT axillary lymph node. 3. Further evaluation with MRI and surgical consultation will be recommended following biopsies to evaluate single duct spontaneous discharge.  I have discussed the findings and recommendations with the patient. If applicable, a reminder letter will be sent to the patient regarding the next appointment.  BI-RADS CATEGORY 4: Suspicious.   pathology biopsy 02/22/2019   DIAGNOSIS: A. 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  Comment: No invasive carcinoma is seen in this sample.  B. LYMPH NODE, LEFT AXILLA; ULTRASOUND-GUIDED CORE BIOPSY: - LYMPH NODE WITH TATTOO PIGMENT. - NEGATIVE FOR MALIGNANCY.   Past Surgical History Breast Biopsy  Left.  Diagnostic Studies History Colonoscopy  never Mammogram  within last year Pap Smear  >5 years ago  Allergies  No Known Drug Allergies [03/18/2019]:  Medication History No Current Medications Medications Reconciled  Pregnancy / Birth History Age at menarche  29  years. Contraceptive History  Oral contraceptives. Gravida  0 Regular periods   Other Problems No pertinent past medical history     Review of Systems General Present- Night Sweats. Not Present- Appetite Loss, Chills, Fatigue, Fever, Weight Gain and Weight Loss. Skin Not Present- Change in Wart/Mole, Dryness, Hives, Jaundice, New Lesions, Non-Healing Wounds, Rash and Ulcer. HEENT Not Present- Earache, Hearing Loss, Hoarseness, Nose Bleed, Oral Ulcers, Ringing in the Ears, Seasonal Allergies, Sinus Pain, Sore Throat, Visual Disturbances, Wears glasses/contact lenses and Yellow Eyes. Respiratory Present- Snoring. Not Present- Bloody sputum, Chronic Cough, Difficulty Breathing and Wheezing. Breast Present- Nipple Discharge. Not Present- Breast Mass, Breast Pain and Skin Changes. Cardiovascular Not Present- Chest Pain, Difficulty Breathing Lying Down, Leg Cramps, Palpitations, Rapid Heart Rate, Shortness of Breath and Swelling of Extremities. Gastrointestinal Not Present- Abdominal Pain, Bloating, Bloody Stool, Change in Bowel Habits, Chronic diarrhea, Constipation, Difficulty Swallowing, Excessive gas, Gets full quickly at meals, Hemorrhoids, Indigestion, Nausea, Rectal Pain and Vomiting. Female Genitourinary Not Present- Frequency, Nocturia, Painful Urination, Pelvic Pain and Urgency. Musculoskeletal Not Present- Back Pain, Joint Pain, Joint Stiffness, Muscle Pain, Muscle Weakness and Swelling of Extremities. Neurological Not Present- Decreased Memory, Fainting, Headaches, Numbness, Seizures, Tingling, Tremor, Trouble walking and Weakness. Psychiatric Present- Anxiety. Not Present- Bipolar, Change in Sleep Pattern, Depression, Fearful and Frequent crying. Endocrine Not Present- Cold Intolerance, Excessive Hunger, Hair Changes, Heat Intolerance, Hot flashes and New Diabetes. Hematology Not Present- Blood Thinners, Easy Bruising, Excessive bleeding, Gland problems, HIV and Persistent  Infections.  Vitals Weight: 118.25 lb Height: 62.5in Body Surface Area: 1.54 m Body Mass Index: 21.28 kg/m  Temp.: 98.32F(Oral)  Pulse: 101 (Regular)  P.OX: 96% (Room air)       Physical Exam General Mental Status-Alert. General Appearance-Consistent with stated age. Hydration-Well hydrated. Voice-Normal.  Head and Neck Head-normocephalic, atraumatic with no lesions or palpable masses. Trachea-midline. Thyroid Gland Characteristics - normal size and consistency.  Eye Eyeball - Bilateral-Extraocular movements intact. Sclera/Conjunctiva - Bilateral-No scleral icterus.  Chest and Lung Exam Chest and lung exam reveals -quiet, even and easy respiratory effort with no use of accessory muscles and on auscultation, normal breath sounds, no adventitious sounds and normal vocal resonance. Inspection Chest Wall - Normal. Back - normal.  Breast Note: breasts are symmetric bilaterally. no palpable masses. breasts are quite dense. minimal ptosis. bloody nipple discharge is present nearly centrally on the right nipple, maybe slightly lower outer portion. no LAD.   Cardiovascular Cardiovascular examination reveals -normal heart sounds, regular rate and rhythm with no murmurs and normal pedal pulses bilaterally.  Abdomen Inspection Inspection of the abdomen reveals - No Hernias. Palpation/Percussion Palpation and Percussion of the abdomen reveal - Soft, Non Tender, No Rebound tenderness, No Rigidity (guarding) and No hepatosplenomegaly. Auscultation Auscultation of the abdomen reveals - Bowel sounds normal.  Neurologic Neurologic evaluation reveals -alert and oriented x 3 with no impairment of recent or remote memory. Mental Status-Normal.  Musculoskeletal Global Assessment -Note: no gross deformities.  Normal Exam - Left-Upper Extremity Strength Normal and Lower Extremity Strength Normal. Normal Exam - Right-Upper Extremity  Strength Normal and Lower Extremity Strength Normal.  Lymphatic Head & Neck  General Head & Neck Lymphatics: Bilateral - Description - Normal. Axillary  General Axillary Region: Bilateral - Description - Normal. Tenderness - Non Tender. Femoral & Inguinal  Generalized Femoral & Inguinal Lymphatics: Bilateral - Description - No Generalized lymphadenopathy.  Assessment & Plan BLOODY DISCHARGE FROM LEFT NIPPLE (N64.52) Impression: Pt has an intraductal papilloma with bloody nipple discharge and ADH. Will plan seed localized excisional breast biopsy with major duct excision.  The surgical procedure was described to the patient. I discussed the incision type and location and that we would need radiology involved on with a wire or seed marker and/or sentinel node.  The risks and benefits of the procedure were described to the patient and she wishes to proceed.  We discussed the risks bleeding, infection, damage to other structures, need for further procedures/surgeries. We discussed the risk of seroma. The patient was advised if the area in the breast in cancer, we may need to go back to surgery for additional tissue to obtain negative margins or for a lymph node biopsy. The patient was advised that these are the most common complications, but that others can occur as well. They were advised against taking aspirin or other anti-inflammatory agents/blood thinners the week before surgery. Current Plans You are being scheduled for surgery- Our schedulers will call you.  You should hear from our office's scheduling department within 5 working days about the location, date, and time of surgery. We try to make accommodations for patient's preferences in scheduling surgery, but sometimes the OR schedule or the surgeon's schedule prevents [place] from making those accommodations.  If you have not heard from our office [phone]) in 5 working days, call the office and ask for your surgeon's  nurse.  If you have other questions about your diagnosis, plan, or surgery, call the office and ask for your surgeon's nurse.  Pt Education - [facility] Breast Biopsy HCI: discussed with patient and provided information.

## 2019-05-20 NOTE — Anesthesia Preprocedure Evaluation (Addendum)
Anesthesia Evaluation  Patient identified by MRN, date of birth, ID band  Reviewed: Allergy & Precautions, NPO status , Patient's Chart, lab work & pertinent test results  Airway Mallampati: II  TM Distance: >3 FB Neck ROM: Full    Dental no notable dental hx. (+) Teeth Intact, Dental Advisory Given   Pulmonary neg pulmonary ROS, Current Smoker and Patient abstained from smoking.,    Pulmonary exam normal breath sounds clear to auscultation       Cardiovascular Exercise Tolerance: Good Normal cardiovascular exam Rhythm:Regular Rate:Normal     Neuro/Psych    GI/Hepatic negative GI ROS, Neg liver ROS,   Endo/Other  negative endocrine ROS  Renal/GU negative Renal ROS     Musculoskeletal negative musculoskeletal ROS (+)   Abdominal   Peds  Hematology   Anesthesia Other Findings   Reproductive/Obstetrics                            Anesthesia Physical Anesthesia Plan  ASA: II  Anesthesia Plan: General   Post-op Pain Management:    Induction: Intravenous  PONV Risk Score and Plan: 3 and Treatment may vary due to age or medical condition, Ondansetron, Dexamethasone, Scopolamine patch - Pre-op, Midazolam and TIVA  Airway Management Planned: LMA  Additional Equipment:   Intra-op Plan:   Post-operative Plan: Extubation in OR  Informed Consent: I have reviewed the patients History and Physical, chart, labs and discussed the procedure including the risks, benefits and alternatives for the proposed anesthesia with the patient or authorized representative who has indicated his/her understanding and acceptance.     Dental advisory given  Plan Discussed with:   Anesthesia Plan Comments:        Anesthesia Quick Evaluation

## 2019-05-20 NOTE — Progress Notes (Signed)

## 2019-05-21 ENCOUNTER — Ambulatory Visit
Admission: RE | Admit: 2019-05-21 | Discharge: 2019-05-21 | Disposition: A | Discharge status: Home / Self Care | Payer: 59 | Source: Ambulatory Visit | Attending: General Surgery | Admitting: General Surgery

## 2019-05-21 ENCOUNTER — Ambulatory Visit (HOSPITAL_BASED_OUTPATIENT_CLINIC_OR_DEPARTMENT_OTHER)
Admission: RE | Admit: 2019-05-21 | Discharge: 2019-05-21 | Disposition: A | Discharge status: Home / Self Care | Payer: 59 | Attending: General Surgery | Admitting: General Surgery

## 2019-05-21 ENCOUNTER — Encounter (HOSPITAL_BASED_OUTPATIENT_CLINIC_OR_DEPARTMENT_OTHER)
Admission: RE | Disposition: A | Discharge status: Home / Self Care | Payer: Self-pay | Source: Home / Self Care | Attending: General Surgery

## 2019-05-21 ENCOUNTER — Ambulatory Visit (HOSPITAL_BASED_OUTPATIENT_CLINIC_OR_DEPARTMENT_OTHER): Payer: 59 | Admitting: Anesthesiology

## 2019-05-21 ENCOUNTER — Encounter (HOSPITAL_BASED_OUTPATIENT_CLINIC_OR_DEPARTMENT_OTHER): Payer: Self-pay

## 2019-05-21 ENCOUNTER — Other Ambulatory Visit: Payer: Self-pay

## 2019-05-21 DIAGNOSIS — D242 Benign neoplasm of left breast: Secondary | ICD-10-CM | POA: Diagnosis present

## 2019-05-21 DIAGNOSIS — Z803 Family history of malignant neoplasm of breast: Secondary | ICD-10-CM | POA: Diagnosis not present

## 2019-05-21 DIAGNOSIS — F17200 Nicotine dependence, unspecified, uncomplicated: Secondary | ICD-10-CM | POA: Diagnosis not present

## 2019-05-21 DIAGNOSIS — N6452 Nipple discharge: Secondary | ICD-10-CM

## 2019-05-21 DIAGNOSIS — D0512 Intraductal carcinoma in situ of left breast: Secondary | ICD-10-CM | POA: Diagnosis not present

## 2019-05-21 HISTORY — DX: Anxiety disorder, unspecified: F41.9

## 2019-05-21 HISTORY — PX: RADIOACTIVE SEED GUIDED EXCISIONAL BREAST BIOPSY: SHX6490

## 2019-05-21 LAB — POCT PREGNANCY, URINE: Preg Test, Ur: NEGATIVE

## 2019-05-21 SURGERY — RADIOACTIVE SEED GUIDED BREAST BIOPSY
Anesthesia: General | Site: Breast | Laterality: Left

## 2019-05-21 MED ORDER — HYDROMORPHONE HCL 1 MG/ML IJ SOLN: 0.2500 mg | INTRAMUSCULAR | Status: DC | PRN

## 2019-05-21 MED ORDER — CHLORHEXIDINE GLUCONATE CLOTH 2 % EX PADS: 6.0000 | MEDICATED_PAD | Freq: Once | CUTANEOUS | Status: DC

## 2019-05-21 MED ORDER — PROPOFOL 500 MG/50ML IV EMUL
INTRAVENOUS | Status: DC | PRN
  Administered 2019-05-21: 25 ug/kg/min via INTRAVENOUS

## 2019-05-21 MED ORDER — SCOPOLAMINE 1 MG/3DAYS TD PT72
MEDICATED_PATCH | TRANSDERMAL | Status: AC
  Filled 2019-05-21: qty 1

## 2019-05-21 MED ORDER — BUPIVACAINE HCL (PF) 0.5 % IJ SOLN
INTRAMUSCULAR | Status: AC
  Filled 2019-05-21: qty 30

## 2019-05-21 MED ORDER — DEXAMETHASONE SODIUM PHOSPHATE 10 MG/ML IJ SOLN
INTRAMUSCULAR | Status: AC
  Filled 2019-05-21: qty 1

## 2019-05-21 MED ORDER — LIDOCAINE 2% (20 MG/ML) 5 ML SYRINGE
INTRAMUSCULAR | Status: AC
  Filled 2019-05-21: qty 5

## 2019-05-21 MED ORDER — ACETAMINOPHEN 500 MG PO TABS
ORAL_TABLET | ORAL | Status: AC
  Filled 2019-05-21: qty 2

## 2019-05-21 MED ORDER — LIDOCAINE HCL (CARDIAC) PF 100 MG/5ML IV SOSY
PREFILLED_SYRINGE | INTRAVENOUS | Status: DC | PRN
  Administered 2019-05-21: 80 mg via INTRAVENOUS

## 2019-05-21 MED ORDER — DEXAMETHASONE SODIUM PHOSPHATE 10 MG/ML IJ SOLN
INTRAMUSCULAR | Status: DC | PRN
  Administered 2019-05-21: 4 mg via INTRAVENOUS

## 2019-05-21 MED ORDER — ONDANSETRON HCL 4 MG/2ML IJ SOLN
INTRAMUSCULAR | Status: AC
  Filled 2019-05-21: qty 2

## 2019-05-21 MED ORDER — OXYCODONE HCL 5 MG PO TABS: 5.0000 mg | ORAL_TABLET | Freq: Once | ORAL | Status: DC | PRN

## 2019-05-21 MED ORDER — SCOPOLAMINE 1 MG/3DAYS TD PT72
1.0000 | MEDICATED_PATCH | Freq: Once | TRANSDERMAL | Status: DC
  Administered 2019-05-21: 07:00:00 1.5 mg via TRANSDERMAL

## 2019-05-21 MED ORDER — PROPOFOL 10 MG/ML IV BOLUS
INTRAVENOUS | Status: AC
  Filled 2019-05-21: qty 40

## 2019-05-21 MED ORDER — LIDOCAINE-EPINEPHRINE (PF) 1 %-1:200000 IJ SOLN
INTRAMUSCULAR | Status: DC | PRN
  Administered 2019-05-21: 34 mL via INTRAMUSCULAR

## 2019-05-21 MED ORDER — FENTANYL CITRATE (PF) 100 MCG/2ML IJ SOLN
INTRAMUSCULAR | Status: DC | PRN
  Administered 2019-05-21: 25 ug via INTRAVENOUS
  Administered 2019-05-21: 50 ug via INTRAVENOUS
  Administered 2019-05-21: 25 ug via INTRAVENOUS

## 2019-05-21 MED ORDER — ACETAMINOPHEN 500 MG PO TABS
1000.0000 mg | ORAL_TABLET | ORAL | Status: AC
  Administered 2019-05-21: 07:00:00 1000 mg via ORAL

## 2019-05-21 MED ORDER — MIDAZOLAM HCL 2 MG/2ML IJ SOLN
INTRAMUSCULAR | Status: DC | PRN
  Administered 2019-05-21: 2 mg via INTRAVENOUS

## 2019-05-21 MED ORDER — CEFAZOLIN SODIUM-DEXTROSE 2-4 GM/100ML-% IV SOLN
INTRAVENOUS | Status: AC
  Filled 2019-05-21: qty 100

## 2019-05-21 MED ORDER — MIDAZOLAM HCL 2 MG/2ML IJ SOLN
INTRAMUSCULAR | Status: AC
  Filled 2019-05-21: qty 2

## 2019-05-21 MED ORDER — LACTATED RINGERS IV SOLN
INTRAVENOUS | Status: DC
  Administered 2019-05-21: 07:00:00 via INTRAVENOUS

## 2019-05-21 MED ORDER — BUPIVACAINE HCL (PF) 0.25 % IJ SOLN
INTRAMUSCULAR | Status: AC
  Filled 2019-05-21: qty 180

## 2019-05-21 MED ORDER — METHYLENE BLUE 0.5 % INJ SOLN
INTRAVENOUS | Status: AC
  Filled 2019-05-21: qty 10

## 2019-05-21 MED ORDER — CEFAZOLIN SODIUM-DEXTROSE 2-4 GM/100ML-% IV SOLN
2.0000 g | INTRAVENOUS | Status: AC
  Administered 2019-05-21: 2 g via INTRAVENOUS

## 2019-05-21 MED ORDER — FENTANYL CITRATE (PF) 100 MCG/2ML IJ SOLN
INTRAMUSCULAR | Status: AC
  Filled 2019-05-21: qty 2

## 2019-05-21 MED ORDER — OXYCODONE HCL 5 MG/5ML PO SOLN: 5.0000 mg | Freq: Once | ORAL | Status: DC | PRN

## 2019-05-21 MED ORDER — PHENYLEPHRINE 40 MCG/ML (10ML) SYRINGE FOR IV PUSH (FOR BLOOD PRESSURE SUPPORT)
PREFILLED_SYRINGE | INTRAVENOUS | Status: DC | PRN
  Administered 2019-05-21 (×2): 80 ug via INTRAVENOUS
  Administered 2019-05-21: 40 ug via INTRAVENOUS
  Administered 2019-05-21 (×2): 80 ug via INTRAVENOUS

## 2019-05-21 MED ORDER — LIDOCAINE-EPINEPHRINE (PF) 1 %-1:200000 IJ SOLN
INTRAMUSCULAR | Status: AC
  Filled 2019-05-21: qty 30

## 2019-05-21 MED ORDER — KETOROLAC TROMETHAMINE 30 MG/ML IJ SOLN: 30.0000 mg | Freq: Once | INTRAMUSCULAR | Status: DC | PRN

## 2019-05-21 MED ORDER — PROPOFOL 10 MG/ML IV BOLUS
INTRAVENOUS | Status: DC | PRN
  Administered 2019-05-21: 130 mg via INTRAVENOUS

## 2019-05-21 MED ORDER — OXYCODONE HCL 5 MG PO TABS: 5.0000 mg | ORAL_TABLET | Freq: Four times a day (QID) | ORAL | 0 refills | Status: DC | PRN

## 2019-05-21 MED ORDER — ONDANSETRON HCL 4 MG/2ML IJ SOLN: 4.0000 mg | Freq: Once | INTRAMUSCULAR | Status: DC | PRN

## 2019-05-21 MED ORDER — SODIUM CHLORIDE (PF) 0.9 % IJ SOLN
INTRAMUSCULAR | Status: AC
  Filled 2019-05-21: qty 10

## 2019-05-21 MED ORDER — ONDANSETRON HCL 4 MG/2ML IJ SOLN
INTRAMUSCULAR | Status: DC | PRN
  Administered 2019-05-21: 4 mg via INTRAVENOUS

## 2019-05-21 SURGICAL SUPPLY — 46 items
BINDER BREAST MEDIUM (GAUZE/BANDAGES/DRESSINGS) ×3 IMPLANT
BLADE SURG 10 STRL SS (BLADE) ×3 IMPLANT
CANISTER SUCT 1200ML W/VALVE (MISCELLANEOUS) ×3 IMPLANT
CHLORAPREP W/TINT 26 (MISCELLANEOUS) ×3 IMPLANT
CLIP VESOCCLUDE LG 6/CT (CLIP) ×3 IMPLANT
CLOSURE WOUND 1/2 X4 (GAUZE/BANDAGES/DRESSINGS) ×1
COVER BACK TABLE REUSABLE LG (DRAPES) ×3 IMPLANT
COVER MAYO STAND REUSABLE (DRAPES) ×3 IMPLANT
COVER PROBE W GEL 5X96 (DRAPES) ×3 IMPLANT
DERMABOND ADVANCED (GAUZE/BANDAGES/DRESSINGS) ×2
DERMABOND ADVANCED .7 DNX12 (GAUZE/BANDAGES/DRESSINGS) ×1 IMPLANT
DRAPE LAPAROSCOPIC ABDOMINAL (DRAPES) ×3 IMPLANT
DRAPE UTILITY XL STRL (DRAPES) ×3 IMPLANT
DRSG PAD ABDOMINAL 8X10 ST (GAUZE/BANDAGES/DRESSINGS) ×3 IMPLANT
ELECT COATED BLADE 2.86 ST (ELECTRODE) ×3 IMPLANT
ELECT REM PT RETURN 9FT ADLT (ELECTROSURGICAL) ×3
ELECTRODE REM PT RTRN 9FT ADLT (ELECTROSURGICAL) ×1 IMPLANT
GAUZE SPONGE 4X4 12PLY STRL LF (GAUZE/BANDAGES/DRESSINGS) ×3 IMPLANT
GLOVE BIO SURGEON STRL SZ 6 (GLOVE) ×3 IMPLANT
GLOVE BIO SURGEON STRL SZ 6.5 (GLOVE) ×2 IMPLANT
GLOVE BIO SURGEONS STRL SZ 6.5 (GLOVE) ×1
GLOVE BIOGEL PI IND STRL 6.5 (GLOVE) ×1 IMPLANT
GLOVE BIOGEL PI IND STRL 7.0 (GLOVE) ×1 IMPLANT
GLOVE BIOGEL PI INDICATOR 6.5 (GLOVE) ×2
GLOVE BIOGEL PI INDICATOR 7.0 (GLOVE) ×2
GOWN STRL REUS W/ TWL LRG LVL3 (GOWN DISPOSABLE) ×1 IMPLANT
GOWN STRL REUS W/TWL 2XL LVL3 (GOWN DISPOSABLE) ×3 IMPLANT
GOWN STRL REUS W/TWL LRG LVL3 (GOWN DISPOSABLE) ×2
KIT MARKER MARGIN INK (KITS) ×3 IMPLANT
NEEDLE HYPO 25X1 1.5 SAFETY (NEEDLE) ×3 IMPLANT
NS IRRIG 1000ML POUR BTL (IV SOLUTION) ×3 IMPLANT
PACK BASIN DAY SURGERY FS (CUSTOM PROCEDURE TRAY) ×3 IMPLANT
PENCIL SMOKE EVACUATOR (MISCELLANEOUS) ×3 IMPLANT
SLEEVE SCD COMPRESS KNEE MED (MISCELLANEOUS) ×3 IMPLANT
SPONGE LAP 18X18 RF (DISPOSABLE) ×3 IMPLANT
STRIP CLOSURE SKIN 1/2X4 (GAUZE/BANDAGES/DRESSINGS) ×2 IMPLANT
SUT MON AB 4-0 PC3 18 (SUTURE) ×3 IMPLANT
SUT VIC AB 3-0 SH 27 (SUTURE) ×2
SUT VIC AB 3-0 SH 27X BRD (SUTURE) ×1 IMPLANT
SYR BULB 3OZ (MISCELLANEOUS) ×3 IMPLANT
SYR CONTROL 10ML LL (SYRINGE) ×3 IMPLANT
TOWEL GREEN STERILE FF (TOWEL DISPOSABLE) ×3 IMPLANT
TRAY FAXITRON CT DISP (TRAY / TRAY PROCEDURE) ×3 IMPLANT
TUBE CONNECTING 20'X1/4 (TUBING) ×1
TUBE CONNECTING 20X1/4 (TUBING) ×2 IMPLANT
YANKAUER SUCT BULB TIP NO VENT (SUCTIONS) ×3 IMPLANT

## 2019-05-21 NOTE — Anesthesia Procedure Notes (Signed)
Procedure Name: LMA Insertion Date/Time: 05/21/2019 7:55 AM Performed by: Raenette Rover, CRNA Pre-anesthesia Checklist: Patient identified, Emergency Drugs available, Suction available and Patient being monitored Patient Re-evaluated:Patient Re-evaluated prior to induction Oxygen Delivery Method: Circle system utilized Preoxygenation: Pre-oxygenation with 100% oxygen Induction Type: IV induction LMA: LMA inserted LMA Size: 4.0 Number of attempts: 1 Placement Confirmation: positive ETCO2 and breath sounds checked- equal and bilateral Tube secured with: Tape Dental Injury: Teeth and Oropharynx as per pre-operative assessment

## 2019-05-21 NOTE — Discharge Instructions (Addendum)
[place] Office Phone Number [phone]  BREAST BIOPSY/ PARTIAL MASTECTOMY: POST OP INSTRUCTIONS  Always review your discharge instruction sheet given to you by the facility where your surgery was performed.  IF YOU HAVE DISABILITY OR FAMILY LEAVE FORMS, YOU MUST BRING THEM TO THE OFFICE FOR PROCESSING.  DO NOT GIVE THEM TO YOUR DOCTOR.  1. A prescription for pain medication may be given to you upon discharge.  Take your pain medication as prescribed, if needed.  If narcotic pain medicine is not needed, then you may take acetaminophen (Tylenol) or ibuprofen (Advil) as needed. No Tylenol until 1pm. 2. Take your usually prescribed medications unless otherwise directed 3. If you need a refill on your pain medication, please contact your pharmacy.  They will contact our office to request authorization.  Prescriptions will not be filled after 5pm or on week-ends. 4. You should eat very light the first 24 hours after surgery, such as soup, crackers, pudding, etc.  Resume your normal diet the day after surgery. 5. Most patients will experience some swelling and bruising in the breast.  Ice packs and a good support bra will help.  Swelling and bruising can take several days to resolve.  6. It is common to experience some constipation if taking pain medication after surgery.  Increasing fluid intake and taking a stool softener will usually help or prevent this problem from occurring.  A mild laxative (Milk of Magnesia or Miralax) should be taken according to package directions if there are no bowel movements after 48 hours. 7. Unless discharge instructions indicate otherwise, you may remove your bandages 48 hours after surgery, and you may shower at that time.  You may have steri-strips (small skin tapes) in place directly over the incision.  These strips should be left on the skin for 7-10 days.   Any sutures or staples will be removed at the office during your follow-up  visit. 8. ACTIVITIES:  You may resume regular daily activities (gradually increasing) beginning the next day.  Wearing a good support bra or sports bra (or the breast binder) minimizes pain and swelling.  You may have sexual intercourse when it is comfortable. a. You may drive when you no longer are taking prescription pain medication, you can comfortably wear a seatbelt, and you can safely maneuver your car and apply brakes. b. RETURN TO WORK:  __________1 week_______________ 9. You should see your doctor in the office for a follow-up appointment approximately two weeks after your surgery.  Your doctors nurse will typically make your follow-up appointment when she calls you with your pathology report.  Expect your pathology report 2-3 business days after your surgery.  You may call to check if you do not hear from [place] after three days.   WHEN TO CALL YOUR DOCTOR: 1. Fever over 101.0 2. Nausea and/or vomiting. 3. Extreme swelling or bruising. 4. Continued bleeding from incision. 5. Increased pain, redness, or drainage from the incision.  The clinic staff is available to answer your questions during regular business hours.  Please dont hesitate to call and ask to speak to one of the nurses for clinical concerns.  If you have a medical emergency, go to the nearest emergency room or call 911.  A surgeon from [facility] is always on call at the hospital.  For further questions, please visit [email or web address]     Post Anesthesia Home Care Instructions  Activity: Get plenty of rest for the remainder of the day. A responsible individual must  stay with you for 24 hours following the procedure.  For the next 24 hours, DO NOT: -Drive a car -Paediatric nurse -Drink alcoholic beverages -Take any medication unless instructed by your physician -Make any legal decisions or sign important papers.  Meals: Start with liquid foods such as gelatin or soup. Progress to regular  foods as tolerated. Avoid greasy, spicy, heavy foods. If nausea and/or vomiting occur, drink only clear liquids until the nausea and/or vomiting subsides. Call your physician if vomiting continues.  Special Instructions/Symptoms: Your throat may feel dry or sore from the anesthesia or the breathing tube placed in your throat during surgery. If this causes discomfort, gargle with warm salt water. The discomfort should disappear within 24 hours.  If you had a scopolamine patch placed behind your ear for the management of post- operative nausea and/or vomiting:  1. The medication in the patch is effective for 72 hours, after which it should be removed.  Wrap patch in a tissue and discard in the trash. Wash hands thoroughly with soap and water. 2. You may remove the patch earlier than 72 hours if you experience unpleasant side effects which may include dry mouth, dizziness or visual disturbances. 3. Avoid touching the patch. Wash your hands with soap and water after contact with the patch.

## 2019-05-21 NOTE — Interval H&P Note (Signed)
History and Physical Interval Note:  05/21/2019 7:44 AM  Danielle Bishop  has presented today for surgery, with the diagnosis of LEFT BLOODY NIPPLE DISCHARGE.  The various methods of treatment have been discussed with the patient and family. After consideration of risks, benefits and other options for treatment, the patient has consented to  Procedure(s): LEFT RADIOACTIVE SEED GUIDED EXCISIONAL BREAST BIOPSY (Left) as a surgical intervention.  The patient's history has been reviewed, patient examined, no change in status, stable for surgery.  I have reviewed the patient's chart and labs.  Questions were answered to the patient's satisfaction.     Stark Klein

## 2019-05-21 NOTE — Transfer of Care (Signed)
Immediate Anesthesia Transfer of Care Note  Patient: Danielle Bishop  Procedure(s) Performed: LEFT RADIOACTIVE SEED GUIDED EXCISIONAL BREAST BIOPSY (Left Breast)  Patient Location: PACU  Anesthesia Type:General  Level of Consciousness: awake, alert , oriented, drowsy and patient cooperative  Airway & Oxygen Therapy: Patient Spontanous Breathing and Patient connected to nasal cannula oxygen  Post-op Assessment: Report given to RN and Post -op Vital signs reviewed and stable  Post vital signs: Reviewed and stable  Last Vitals:  Vitals Value Taken Time  BP 103/74 05/21/19 0836  Temp    Pulse 84 05/21/19 0837  Resp 12 05/21/19 0837  SpO2 100 % 05/21/19 0837  Vitals shown include unvalidated device data.  Last Pain:  Vitals:   05/21/19 0705  TempSrc: Temporal  PainSc: 0-No pain         Complications: No apparent anesthesia complications

## 2019-05-21 NOTE — Anesthesia Postprocedure Evaluation (Signed)
Anesthesia Post Note  Patient: Danielle Bishop  Procedure(s) Performed: LEFT RADIOACTIVE SEED GUIDED EXCISIONAL BREAST BIOPSY (Left Breast)     Patient location during evaluation: PACU Anesthesia Type: General Level of consciousness: awake and alert Pain management: pain level controlled Vital Signs Assessment: post-procedure vital signs reviewed and stable Respiratory status: spontaneous breathing, nonlabored ventilation, respiratory function stable and patient connected to nasal cannula oxygen Cardiovascular status: blood pressure returned to baseline and stable Postop Assessment: no apparent nausea or vomiting Anesthetic complications: no    Last Vitals:  Vitals:   05/21/19 0900 05/21/19 0916  BP: 108/77 111/82  Pulse: 66 77  Resp: 15 18  Temp:  36.6 C  SpO2: 100% 100%    Last Pain:  Vitals:   05/21/19 0916  TempSrc:   PainSc: 0-No pain                 Barnet Glasgow

## 2019-05-21 NOTE — Op Note (Signed)
left Breast Radioactive seed localized excisional biopsy  Indications: This patient presents with history of abnormal left mammogram with discordant core needle biopsy.    Pre-operative Diagnosis: abnormal left mammogram    Post-operative Diagnosis: abnormal left mammogram  Surgeon: Stark Klein   Anesthesia: General endotracheal anesthesia  ASA Class: 2  Procedure Details  The patient was seen in the Holding Room. The risks, benefits, complications, treatment options, and expected outcomes were discussed with the patient. The possibilities of bleeding, infection, the need for additional procedures, failure to diagnose a condition, and creating a complication requiring transfusion or operation were discussed with the patient. The patient concurred with the proposed plan, giving informed consent.  The site of surgery properly noted/marked. The patient was taken to Operating Room # 8, identified, and the procedure verified as left Breast seed localized excisional biopsy. A Time Out was held and the above information confirmed.  The left breast and chest were prepped and draped in standard fashion. A inferior circumareolar incision was made near the previously placed radioactive seed.  Dissection was carried down around the point of maximum signal intensity. The cautery was used to perform the dissection.   The specimen was inked with the margin marker paint kit.    Specimen radiography confirmed inclusion of the mammographic lesion, the clip, and the seed.  The background signal in the breast was zero.   Hemostasis was achieved with cautery.  Local anesthetic was injected into the surrounding areas and skin.  The wound was irrigated and closed with 3-0 vicryl interrupted deep dermal sutures and 4-0 monocryl running subcuticular suture.      Sterile dressings were applied. At the end of the operation, all sponge, instrument, and needle counts were correct.  Findings: Seed, clip in specimen.     Estimated Blood Loss:  min         Specimens: left breast tissue with seed         Complications:  None; patient tolerated the procedure well.         Disposition: PACU - hemodynamically stable.         Condition: stable

## 2019-05-22 ENCOUNTER — Encounter (HOSPITAL_BASED_OUTPATIENT_CLINIC_OR_DEPARTMENT_OTHER): Payer: Self-pay | Admitting: General Surgery

## 2019-05-23 ENCOUNTER — Other Ambulatory Visit: Payer: Self-pay

## 2019-05-24 LAB — SURGICAL PATHOLOGY

## 2019-05-27 ENCOUNTER — Telehealth: Payer: Self-pay | Admitting: General Surgery

## 2019-05-27 ENCOUNTER — Other Ambulatory Visit: Payer: Self-pay | Admitting: General Surgery

## 2019-05-27 NOTE — Telephone Encounter (Signed)
Discussed pathology with patient.  Will refer to onc and rad onc in [place].   Will need reexcision.

## 2019-06-13 ENCOUNTER — Encounter (HOSPITAL_BASED_OUTPATIENT_CLINIC_OR_DEPARTMENT_OTHER): Payer: Self-pay | Admitting: General Surgery

## 2019-06-15 ENCOUNTER — Other Ambulatory Visit (HOSPITAL_COMMUNITY)
Admission: RE | Admit: 2019-06-15 | Discharge: 2019-06-15 | Disposition: A | Discharge status: Home / Self Care | Payer: 59 | Source: Ambulatory Visit | Attending: General Surgery | Admitting: General Surgery

## 2019-06-15 DIAGNOSIS — Z20822 Contact with and (suspected) exposure to covid-19: Secondary | ICD-10-CM | POA: Insufficient documentation

## 2019-06-15 DIAGNOSIS — Z01812 Encounter for preprocedural laboratory examination: Secondary | ICD-10-CM | POA: Diagnosis present

## 2019-06-16 LAB — SARS CORONAVIRUS 2 (TAT 6-24 HRS): SARS Coronavirus 2: NEGATIVE

## 2019-06-17 ENCOUNTER — Encounter (HOSPITAL_BASED_OUTPATIENT_CLINIC_OR_DEPARTMENT_OTHER): Payer: Self-pay | Admitting: General Surgery

## 2019-06-17 ENCOUNTER — Other Ambulatory Visit: Payer: Self-pay

## 2019-06-17 NOTE — Progress Notes (Signed)
Spoke w/ via phone for pre-op interview--- PT Lab needs dos----  Urine preg             Lab results------ no COVID test ------ done 06-15-2019 Arrive at ------- 0700 NPO after ------ MN w/ exception clear liquids until 0600 then nothing by mouth (no cream/ milk products) Medications to take morning of surgery ----- NONE Diabetic medication ----- n/a Patient Special Instructions ----- n/a Pre-Op special Istructions ----- n/a Patient verbalized understanding of instructions that were given at this phone interview. Patient denies shortness of breath, chest pain, fever, cough a this phone interview.

## 2019-06-18 ENCOUNTER — Encounter (HOSPITAL_BASED_OUTPATIENT_CLINIC_OR_DEPARTMENT_OTHER): Payer: Self-pay | Admitting: General Surgery

## 2019-06-18 ENCOUNTER — Other Ambulatory Visit: Payer: Self-pay

## 2019-06-19 ENCOUNTER — Ambulatory Visit (HOSPITAL_BASED_OUTPATIENT_CLINIC_OR_DEPARTMENT_OTHER): Payer: 59 | Admitting: Anesthesiology

## 2019-06-19 ENCOUNTER — Encounter (HOSPITAL_BASED_OUTPATIENT_CLINIC_OR_DEPARTMENT_OTHER): Payer: Self-pay | Admitting: General Surgery

## 2019-06-19 ENCOUNTER — Other Ambulatory Visit: Payer: Self-pay

## 2019-06-19 ENCOUNTER — Encounter (HOSPITAL_BASED_OUTPATIENT_CLINIC_OR_DEPARTMENT_OTHER)
Admission: RE | Disposition: A | Discharge status: Home / Self Care | Payer: Self-pay | Source: Home / Self Care | Attending: General Surgery

## 2019-06-19 ENCOUNTER — Ambulatory Visit (HOSPITAL_BASED_OUTPATIENT_CLINIC_OR_DEPARTMENT_OTHER)
Admission: RE | Admit: 2019-06-19 | Discharge: 2019-06-19 | Disposition: A | Discharge status: Home / Self Care | Payer: 59 | Attending: General Surgery | Admitting: General Surgery

## 2019-06-19 DIAGNOSIS — D0512 Intraductal carcinoma in situ of left breast: Secondary | ICD-10-CM | POA: Insufficient documentation

## 2019-06-19 DIAGNOSIS — N6489 Other specified disorders of breast: Secondary | ICD-10-CM | POA: Diagnosis not present

## 2019-06-19 DIAGNOSIS — N6022 Fibroadenosis of left breast: Secondary | ICD-10-CM | POA: Diagnosis not present

## 2019-06-19 DIAGNOSIS — Z803 Family history of malignant neoplasm of breast: Secondary | ICD-10-CM | POA: Diagnosis not present

## 2019-06-19 DIAGNOSIS — F17210 Nicotine dependence, cigarettes, uncomplicated: Secondary | ICD-10-CM | POA: Insufficient documentation

## 2019-06-19 HISTORY — PX: RE-EXCISION OF BREAST LUMPECTOMY: SHX6048

## 2019-06-19 HISTORY — DX: Malignant neoplasm of unspecified site of left female breast: C50.912

## 2019-06-19 LAB — POCT PREGNANCY, URINE: Preg Test, Ur: NEGATIVE

## 2019-06-19 SURGERY — EXCISION, LESION, BREAST
Anesthesia: General | Site: Breast | Laterality: Left

## 2019-06-19 MED ORDER — PHENYLEPHRINE 40 MCG/ML (10ML) SYRINGE FOR IV PUSH (FOR BLOOD PRESSURE SUPPORT)
PREFILLED_SYRINGE | INTRAVENOUS | Status: AC
  Filled 2019-06-19: qty 10

## 2019-06-19 MED ORDER — PHENYLEPHRINE 40 MCG/ML (10ML) SYRINGE FOR IV PUSH (FOR BLOOD PRESSURE SUPPORT)
PREFILLED_SYRINGE | INTRAVENOUS | Status: DC | PRN
  Administered 2019-06-19 (×2): 40 ug via INTRAVENOUS

## 2019-06-19 MED ORDER — CEFAZOLIN SODIUM-DEXTROSE 2-4 GM/100ML-% IV SOLN
INTRAVENOUS | Status: AC
  Filled 2019-06-19: qty 100

## 2019-06-19 MED ORDER — CEFAZOLIN SODIUM-DEXTROSE 2-4 GM/100ML-% IV SOLN
2.0000 g | INTRAVENOUS | Status: AC
  Administered 2019-06-19: 2 g via INTRAVENOUS

## 2019-06-19 MED ORDER — LIDOCAINE HCL (CARDIAC) PF 100 MG/5ML IV SOSY
PREFILLED_SYRINGE | INTRAVENOUS | Status: DC | PRN
  Administered 2019-06-19: 60 mg via INTRAVENOUS

## 2019-06-19 MED ORDER — LACTATED RINGERS IV SOLN: INTRAVENOUS | Status: DC

## 2019-06-19 MED ORDER — ACETAMINOPHEN 500 MG PO TABS
ORAL_TABLET | ORAL | Status: AC
  Filled 2019-06-19: qty 2

## 2019-06-19 MED ORDER — BUPIVACAINE HCL (PF) 0.25 % IJ SOLN
INTRAMUSCULAR | Status: DC | PRN
  Administered 2019-06-19: 12.5 mL

## 2019-06-19 MED ORDER — LIDOCAINE-EPINEPHRINE 1 %-1:100000 IJ SOLN
INTRAMUSCULAR | Status: AC
  Filled 2019-06-19: qty 1

## 2019-06-19 MED ORDER — PROPOFOL 500 MG/50ML IV EMUL
INTRAVENOUS | Status: AC
  Filled 2019-06-19: qty 50

## 2019-06-19 MED ORDER — MIDAZOLAM HCL 2 MG/2ML IJ SOLN
INTRAMUSCULAR | Status: DC | PRN
  Administered 2019-06-19: 2 mg via INTRAVENOUS

## 2019-06-19 MED ORDER — BUPIVACAINE HCL (PF) 0.25 % IJ SOLN
INTRAMUSCULAR | Status: AC
  Filled 2019-06-19: qty 30

## 2019-06-19 MED ORDER — OXYCODONE HCL 5 MG PO TABS: 5.0000 mg | ORAL_TABLET | Freq: Once | ORAL | Status: DC | PRN

## 2019-06-19 MED ORDER — MEPERIDINE HCL 25 MG/ML IJ SOLN: 6.2500 mg | INTRAMUSCULAR | Status: DC | PRN

## 2019-06-19 MED ORDER — METOCLOPRAMIDE HCL 5 MG/ML IJ SOLN: 10.0000 mg | Freq: Once | INTRAMUSCULAR | Status: DC | PRN

## 2019-06-19 MED ORDER — OXYCODONE HCL 5 MG/5ML PO SOLN: 5.0000 mg | Freq: Once | ORAL | Status: DC | PRN

## 2019-06-19 MED ORDER — FENTANYL CITRATE (PF) 100 MCG/2ML IJ SOLN
INTRAMUSCULAR | Status: DC | PRN
  Administered 2019-06-19: 25 ug via INTRAVENOUS
  Administered 2019-06-19: 50 ug via INTRAVENOUS
  Administered 2019-06-19: 25 ug via INTRAVENOUS

## 2019-06-19 MED ORDER — CHLORHEXIDINE GLUCONATE CLOTH 2 % EX PADS: 6.0000 | MEDICATED_PAD | Freq: Once | CUTANEOUS | Status: DC

## 2019-06-19 MED ORDER — SCOPOLAMINE 1 MG/3DAYS TD PT72: 1.0000 | MEDICATED_PATCH | TRANSDERMAL | Status: DC

## 2019-06-19 MED ORDER — ENSURE PRE-SURGERY PO LIQD: 296.0000 mL | Freq: Once | ORAL | Status: DC

## 2019-06-19 MED ORDER — PROPOFOL 500 MG/50ML IV EMUL
INTRAVENOUS | Status: DC | PRN
  Administered 2019-06-19: 125 ug/kg/min via INTRAVENOUS

## 2019-06-19 MED ORDER — ONDANSETRON HCL 4 MG/2ML IJ SOLN
INTRAMUSCULAR | Status: AC
  Filled 2019-06-19: qty 2

## 2019-06-19 MED ORDER — BUPIVACAINE HCL (PF) 0.5 % IJ SOLN
INTRAMUSCULAR | Status: AC
  Filled 2019-06-19: qty 30

## 2019-06-19 MED ORDER — LIDOCAINE 2% (20 MG/ML) 5 ML SYRINGE
INTRAMUSCULAR | Status: AC
  Filled 2019-06-19: qty 5

## 2019-06-19 MED ORDER — DEXAMETHASONE SODIUM PHOSPHATE 10 MG/ML IJ SOLN
INTRAMUSCULAR | Status: DC | PRN
  Administered 2019-06-19: 5 mg via INTRAVENOUS

## 2019-06-19 MED ORDER — PROPOFOL 10 MG/ML IV BOLUS
INTRAVENOUS | Status: DC | PRN
  Administered 2019-06-19: 140 mg via INTRAVENOUS

## 2019-06-19 MED ORDER — ONDANSETRON HCL 4 MG/2ML IJ SOLN
INTRAMUSCULAR | Status: DC | PRN
  Administered 2019-06-19: 4 mg via INTRAVENOUS

## 2019-06-19 MED ORDER — FENTANYL CITRATE (PF) 100 MCG/2ML IJ SOLN
INTRAMUSCULAR | Status: AC
  Filled 2019-06-19: qty 2

## 2019-06-19 MED ORDER — SCOPOLAMINE 1 MG/3DAYS TD PT72
MEDICATED_PATCH | TRANSDERMAL | Status: AC
  Filled 2019-06-19: qty 1

## 2019-06-19 MED ORDER — MIDAZOLAM HCL 2 MG/2ML IJ SOLN
INTRAMUSCULAR | Status: AC
  Filled 2019-06-19: qty 2

## 2019-06-19 MED ORDER — LIDOCAINE-EPINEPHRINE 1 %-1:100000 IJ SOLN
INTRAMUSCULAR | Status: DC | PRN
  Administered 2019-06-19: 12.5 mL

## 2019-06-19 MED ORDER — ACETAMINOPHEN 500 MG PO TABS
1000.0000 mg | ORAL_TABLET | ORAL | Status: AC
  Administered 2019-06-19: 1000 mg via ORAL

## 2019-06-19 MED ORDER — DEXAMETHASONE SODIUM PHOSPHATE 10 MG/ML IJ SOLN
INTRAMUSCULAR | Status: AC
  Filled 2019-06-19: qty 1

## 2019-06-19 MED ORDER — SCOPOLAMINE 1 MG/3DAYS TD PT72
1.0000 | MEDICATED_PATCH | TRANSDERMAL | Status: DC
  Administered 2019-06-19: 09:00:00 1.5 mg via TRANSDERMAL

## 2019-06-19 MED ORDER — FENTANYL CITRATE (PF) 100 MCG/2ML IJ SOLN: 25.0000 ug | INTRAMUSCULAR | Status: DC | PRN

## 2019-06-19 MED ORDER — PROPOFOL 10 MG/ML IV BOLUS
INTRAVENOUS | Status: AC
  Filled 2019-06-19: qty 20

## 2019-06-19 SURGICAL SUPPLY — 56 items
BINDER BREAST 3XL (GAUZE/BANDAGES/DRESSINGS) IMPLANT
BINDER BREAST LRG (GAUZE/BANDAGES/DRESSINGS) IMPLANT
BINDER BREAST MEDIUM (GAUZE/BANDAGES/DRESSINGS) ×2 IMPLANT
BINDER BREAST XLRG (GAUZE/BANDAGES/DRESSINGS) IMPLANT
BINDER BREAST XXLRG (GAUZE/BANDAGES/DRESSINGS) IMPLANT
BLADE SURG 10 STRL SS (BLADE) ×3 IMPLANT
BLADE SURG 15 STRL LF DISP TIS (BLADE) IMPLANT
BLADE SURG 15 STRL SS (BLADE)
CANISTER SUCT 1200ML W/VALVE (MISCELLANEOUS) ×3 IMPLANT
CHLORAPREP W/TINT 26 (MISCELLANEOUS) ×3 IMPLANT
CLIP VESOCCLUDE LG 6/CT (CLIP) ×3 IMPLANT
CLOSURE WOUND 1/2 X4 (GAUZE/BANDAGES/DRESSINGS)
COVER BACK TABLE REUSABLE LG (DRAPES) ×3 IMPLANT
COVER MAYO STAND REUSABLE (DRAPES) ×3 IMPLANT
COVER WAND RF STERILE (DRAPES) IMPLANT
DECANTER SPIKE VIAL GLASS SM (MISCELLANEOUS) IMPLANT
DERMABOND ADVANCED (GAUZE/BANDAGES/DRESSINGS) ×2
DERMABOND ADVANCED .7 DNX12 (GAUZE/BANDAGES/DRESSINGS) ×1 IMPLANT
DRAPE LAPAROSCOPIC ABDOMINAL (DRAPES) ×3 IMPLANT
DRAPE UTILITY XL STRL (DRAPES) ×3 IMPLANT
ELECT COATED BLADE 2.86 ST (ELECTRODE) ×3 IMPLANT
ELECT REM PT RETURN 9FT ADLT (ELECTROSURGICAL) ×3
ELECTRODE REM PT RTRN 9FT ADLT (ELECTROSURGICAL) ×1 IMPLANT
GAUZE SPONGE 4X4 12PLY STRL (GAUZE/BANDAGES/DRESSINGS) IMPLANT
GAUZE SPONGE 4X4 12PLY STRL LF (GAUZE/BANDAGES/DRESSINGS) ×3 IMPLANT
GLOVE BIO SURGEON STRL SZ 6 (GLOVE) ×3 IMPLANT
GLOVE BIO SURGEON STRL SZ7 (GLOVE) ×4 IMPLANT
GLOVE BIOGEL PI IND STRL 6.5 (GLOVE) ×1 IMPLANT
GLOVE BIOGEL PI IND STRL 7.0 (GLOVE) IMPLANT
GLOVE BIOGEL PI INDICATOR 6.5 (GLOVE) ×2
GLOVE BIOGEL PI INDICATOR 7.0 (GLOVE) ×2
GOWN STRL REUS W/ TWL LRG LVL3 (GOWN DISPOSABLE) ×1 IMPLANT
GOWN STRL REUS W/TWL 2XL LVL3 (GOWN DISPOSABLE) ×3 IMPLANT
GOWN STRL REUS W/TWL LRG LVL3 (GOWN DISPOSABLE) ×2
KIT MARKER MARGIN INK (KITS) ×3 IMPLANT
NDL HYPO 25X1 1.5 SAFETY (NEEDLE) ×1 IMPLANT
NEEDLE HYPO 25X1 1.5 SAFETY (NEEDLE) ×3 IMPLANT
NS IRRIG 1000ML POUR BTL (IV SOLUTION) ×3 IMPLANT
PACK BASIN DAY SURGERY FS (CUSTOM PROCEDURE TRAY) ×3 IMPLANT
PENCIL SMOKE EVACUATOR (MISCELLANEOUS) ×3 IMPLANT
SLEEVE SCD COMPRESS KNEE MED (MISCELLANEOUS) ×3 IMPLANT
SPONGE LAP 18X18 RF (DISPOSABLE) ×3 IMPLANT
STAPLER VISISTAT 35W (STAPLE) IMPLANT
STRIP CLOSURE SKIN 1/2X4 (GAUZE/BANDAGES/DRESSINGS) ×1 IMPLANT
SUT MON AB 4-0 PC3 18 (SUTURE) ×3 IMPLANT
SUT SILK 2 0 SH (SUTURE) IMPLANT
SUT VIC AB 3-0 54X BRD REEL (SUTURE) IMPLANT
SUT VIC AB 3-0 BRD 54 (SUTURE)
SUT VIC AB 3-0 SH 27 (SUTURE) ×2
SUT VIC AB 3-0 SH 27X BRD (SUTURE) ×1 IMPLANT
SYR BULB 3OZ (MISCELLANEOUS) ×3 IMPLANT
SYR CONTROL 10ML LL (SYRINGE) ×3 IMPLANT
TOWEL GREEN STERILE FF (TOWEL DISPOSABLE) ×3 IMPLANT
TUBE CONNECTING 20'X1/4 (TUBING) ×1
TUBE CONNECTING 20X1/4 (TUBING) ×2 IMPLANT
YANKAUER SUCT BULB TIP NO VENT (SUCTIONS) ×3 IMPLANT

## 2019-06-19 NOTE — Op Note (Signed)
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    Surgeon: Stark Klein   Assistants: n/a   Anesthesia: General anesthesia and Local anesthesia 0.25% bupivacaine, with epinephrine mixed with 1% lidocaine  ASA Class: 2   Procedure Details  The patient was seen in the Holding Room. The risks, benefits, complications, treatment options, and expected outcomes were discussed with the patient. The possibilities of reaction to medication, pulmonary aspiration, bleeding, infection, the need for additional procedures, failure to diagnose a condition, and creating a complication requiring transfusion or operation were discussed with the patient. The patient concurred with the proposed plan, giving informed consent. The site of surgery properly noted/marked. The patient was taken to Operating Room # 1, identified, and the procedure verified as re-excision of left breast cancer.  After induction of anesthesia, the left breast and chest were prepped and draped in standard fashion.  The lumpectomy was performed by reopening the prior incision. Seroma was aspirated. The mastopexy sutures were removed. Additional margins were taken at the inferior and posterior borders of the partial mastectomy cavity. Dissection was carried down to the pectoral fascia. Orientation sutures were placed in the specimens. Hemostasis was achieved with cautery. The wound was irrigated and closed with a 3-0 Vicryl deep dermal interrupted and a 4-0 Monocryl subcuticular closure in layers.  Sterile dressings were applied. At the end of the operation, all sponge, instrument, and needle counts were correct.   Findings:  grossly clear surgical margins, posterior margin is pectoralis  Estimated Blood  Loss: Minimal   Drains: none   Specimens: additional posterior and inferior margins.   Complications: None; patient tolerated the procedure well.   Disposition: PACU - hemodynamically stable.   Condition: stable

## 2019-06-19 NOTE — Interval H&P Note (Signed)
History and Physical Interval Note:  06/19/2019 10:07 AM  Danielle Bishop  has presented today for surgery, with the diagnosis of left breast cancer.  The various methods of treatment have been discussed with the patient and family. After consideration of risks, benefits and other options for treatment, the patient has consented to  Procedure(s): RE-EXCISION OF LEFT BREAST LUMPECTOMY (Left) as a surgical intervention.  The patient's history has been reviewed, patient examined, no change in status, stable for surgery.  I have reviewed the patient's chart and labs.  Questions were answered to the patient's satisfaction.     Stark Klein

## 2019-06-19 NOTE — Discharge Instructions (Addendum)
[place] Office Phone Number [phone]  BREAST BIOPSY/ PARTIAL MASTECTOMY: POST OP INSTRUCTIONS  Always review your discharge instruction sheet given to you by the facility where your surgery was performed.  IF YOU HAVE DISABILITY OR FAMILY LEAVE FORMS, YOU MUST BRING THEM TO THE OFFICE FOR PROCESSING.  DO NOT GIVE THEM TO YOUR DOCTOR.  1. A prescription for pain medication may be given to you upon discharge.  Take your pain medication as prescribed, if needed.  If narcotic pain medicine is not needed, then you may take acetaminophen (Tylenol) or ibuprofen (Advil) as needed. 2. Take your usually prescribed medications unless otherwise directed 3. If you need a refill on your pain medication, please contact your pharmacy.  They will contact our office to request authorization.  Prescriptions will not be filled after 5pm or on week-ends. 4. You should eat very light the first 24 hours after surgery, such as soup, crackers, pudding, etc.  Resume your normal diet the day after surgery. 5. Most patients will experience some swelling and bruising in the breast.  Ice packs and a good support bra will help.  Swelling and bruising can take several days to resolve.  6. It is common to experience some constipation if taking pain medication after surgery.  Increasing fluid intake and taking a stool softener will usually help or prevent this problem from occurring.  A mild laxative (Milk of Magnesia or Miralax) should be taken according to package directions if there are no bowel movements after 48 hours. 7. Unless discharge instructions indicate otherwise, you may remove your bandages 48 hours after surgery, and you may shower at that time.  You may have steri-strips (small skin tapes) in place directly over the incision.  These strips should be left on the skin for 7-10 days.   Any sutures or staples will be removed at the office during your follow-up visit. 8. ACTIVITIES:  You may resume  regular daily activities (gradually increasing) beginning the next day.  Wearing a good support bra or sports bra (or the breast binder) minimizes pain and swelling.  You may have sexual intercourse when it is comfortable. a. You may drive when you no longer are taking prescription pain medication, you can comfortably wear a seatbelt, and you can safely maneuver your car and apply brakes. b. RETURN TO WORK:  __________1 week_______________ 9. You should see your doctor in the office for a follow-up appointment approximately two weeks after your surgery.  Your doctor's nurse will typically make your follow-up appointment when she calls you with your pathology report.  Expect your pathology report 2-3 business days after your surgery.  You may call to check if you do not hear from [place] after three days.   WHEN TO CALL YOUR DOCTOR: 1. Fever over 101.0 2. Nausea and/or vomiting. 3. Extreme swelling or bruising. 4. Continued bleeding from incision. 5. Increased pain, redness, or drainage from the incision.  The clinic staff is available to answer your questions during regular business hours.  Please don't hesitate to call and ask to speak to one of the nurses for clinical concerns.  If you have a medical emergency, go to the nearest emergency room or call 911.  A surgeon from [facility] is always on call at the hospital.  For further questions, please visit [email or web address]     Post Anesthesia Home Care Instructions  Activity: Get plenty of rest for the remainder of the day. A responsible individual must stay with you for  24 hours following the procedure.  For the next 24 hours, DO NOT: -Drive a car -Paediatric nurse -Drink alcoholic beverages -Take any medication unless instructed by your physician -Make any legal decisions or sign important papers.  Meals: Start with liquid foods such as gelatin or soup. Progress to regular foods as tolerated. Avoid greasy, spicy,  heavy foods. If nausea and/or vomiting occur, drink only clear liquids until the nausea and/or vomiting subsides. Call your physician if vomiting continues.  Special Instructions/Symptoms: Your throat may feel dry or sore from the anesthesia or the breathing tube placed in your throat during surgery. If this causes discomfort, gargle with warm salt water. The discomfort should disappear within 24 hours.  If you had a scopolamine patch placed behind your ear for the management of post- operative nausea and/or vomiting:  1. The medication in the patch is effective for 72 hours, after which it should be removed.  Wrap patch in a tissue and discard in the trash. Wash hands thoroughly with soap and water. 2. You may remove the patch earlier than 72 hours if you experience unpleasant side effects which may include dry mouth, dizziness or visual disturbances. 3. Avoid touching the patch. Wash your hands with soap and water after contact with the patch.     You received tylenol at 9:18 am. You may take tylenol again if needed any time after 3:18 pm.

## 2019-06-19 NOTE — Anesthesia Postprocedure Evaluation (Signed)
Anesthesia Post Note  Patient: Danielle Bishop  Procedure(s) Performed: RE-EXCISION OF LEFT BREAST LUMPECTOMY (Left Breast)     Patient location during evaluation: PACU Anesthesia Type: General Level of consciousness: awake and alert and oriented Pain management: pain level controlled Vital Signs Assessment: post-procedure vital signs reviewed and stable Respiratory status: spontaneous breathing, nonlabored ventilation and respiratory function stable Cardiovascular status: blood pressure returned to baseline and stable Postop Assessment: no apparent nausea or vomiting Anesthetic complications: no    Last Vitals:  Vitals:   06/19/19 1115 06/19/19 1130  BP: 99/63 99/65  Pulse: 71 62  Resp: 14 13  Temp:    SpO2: 99% 100%    Last Pain:  Vitals:   06/19/19 1130  TempSrc:   PainSc: 1                  Vicci Reder A.

## 2019-06-19 NOTE — Anesthesia Preprocedure Evaluation (Signed)
Anesthesia Evaluation  Patient identified by MRN, date of birth, ID band Patient awake    Reviewed: Allergy & Precautions, NPO status , Patient's Chart, lab work & pertinent test results  Airway Mallampati: II  TM Distance: >3 FB Neck ROM: Full    Dental no notable dental hx. (+) Teeth Intact, Dental Advisory Given   Pulmonary neg pulmonary ROS, Current Smoker and Patient abstained from smoking.,    Pulmonary exam normal breath sounds clear to auscultation       Cardiovascular Exercise Tolerance: Good Normal cardiovascular exam Rhythm:Regular Rate:Normal     Neuro/Psych PSYCHIATRIC DISORDERS Anxiety    GI/Hepatic negative GI ROS, Neg liver ROS,   Endo/Other  Left Breast Ca  Renal/GU negative Renal ROS  negative genitourinary   Musculoskeletal negative musculoskeletal ROS (+)   Abdominal   Peds  Hematology   Anesthesia Other Findings   Reproductive/Obstetrics                             Anesthesia Physical  Anesthesia Plan  ASA: II  Anesthesia Plan: General   Post-op Pain Management:    Induction: Intravenous  PONV Risk Score and Plan: 3 and Treatment may vary due to age or medical condition, Ondansetron, Dexamethasone, Scopolamine patch - Pre-op, Midazolam and TIVA  Airway Management Planned: LMA  Additional Equipment:   Intra-op Plan:   Post-operative Plan: Extubation in OR  Informed Consent: I have reviewed the patients History and Physical, chart, labs and discussed the procedure including the risks, benefits and alternatives for the proposed anesthesia with the patient or authorized representative who has indicated his/her understanding and acceptance.     Dental advisory given  Plan Discussed with: CRNA and Surgeon  Anesthesia Plan Comments:         Anesthesia Quick Evaluation

## 2019-06-19 NOTE — Anesthesia Procedure Notes (Signed)
Procedure Name: LMA Insertion Date/Time: 06/19/2019 10:20 AM Performed by: Raenette Rover, CRNA Pre-anesthesia Checklist: Patient identified, Emergency Drugs available, Suction available and Patient being monitored Patient Re-evaluated:Patient Re-evaluated prior to induction Oxygen Delivery Method: Circle system utilized Preoxygenation: Pre-oxygenation with 100% oxygen Induction Type: IV induction LMA: LMA inserted LMA Size: 4.0 Number of attempts: 1 Placement Confirmation: positive ETCO2 and breath sounds checked- equal and bilateral Tube secured with: Tape Dental Injury: Teeth and Oropharynx as per pre-operative assessment

## 2019-06-19 NOTE — Transfer of Care (Signed)
Immediate Anesthesia Transfer of Care Note  Patient: Danielle Bishop  Procedure(s) Performed: RE-EXCISION OF LEFT BREAST LUMPECTOMY (Left Breast)  Patient Location: PACU  Anesthesia Type:General  Level of Consciousness: awake, alert , oriented and patient cooperative  Airway & Oxygen Therapy: Patient Spontanous Breathing  Post-op Assessment: Report given to RN and Post -op Vital signs reviewed and stable  Post vital signs: Reviewed and stable  Last Vitals:  Vitals Value Taken Time  BP 97/59 06/19/19 1105  Temp    Pulse 72 06/19/19 1108  Resp 16 06/19/19 1108  SpO2 100 % 06/19/19 1108  Vitals shown include unvalidated device data.  Last Pain:  Vitals:   06/19/19 0856  TempSrc: Temporal  PainSc: 0-No pain      Patients Stated Pain Goal: 6 ([id number])  Complications: No apparent anesthesia complications

## 2019-06-21 ENCOUNTER — Encounter: Payer: Self-pay | Admitting: *Deleted

## 2019-06-23 NOTE — Progress Notes (Addendum)
[facility]  [address], [address] [place], [place] [address] Phone: [phone]  Fax: [phone]   Clinic Day:  06/24/2019  Referring physician: Juline Patch, MD  Chief Complaint: Danielle Bishop is a 49 y.o. female with left breast DCIS who is referred in consultation with Dr. Otilio Miu for assessment and management.   HPI: The patient was seen by Dr. Ronnald Ramp for nipple discharge on 02/07/2019.  She noted occasional brown spots on her shirts/bras.  Bilateral mammogram w/ left axillary ultrasound on 02/14/2019 showed duct ectasia in the 6-7 o'clock location of the LEFT breast without identifiable intraductal mass.  There was a solid 0.7 x 0.5 x 0.7 cm mass in the 7 o'clock location 2 cm from the nipple in the LEFT breast warranting tissue diagnosis. There was an enlarged LEFT axillary lymph node warranting biopsy.  A single duct spontaneous LEFT nipple discharge warranted further evaluation.  She underwent left breast biopsy at the 7 o'clock position and left clip placement on 02/22/2019. 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  Lymph node in left axilla was negative for malignancy.   She underwent a left breast seed localized excisional biopsy on 05/21/2019 by Dr. Barry Dienes.  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  The posterior/inferior margin was focally positive.  DCIS was present < 1 mm from the superior, medial and lateral margins.  Stage was pTispNX.  DCIS was ER+ (95%) and PR+ (100%).   She underwent re-excision of left breast lumpectomy by Dr. Barry Dienes on 06/19/2019. Results are pending.   Symptomatically, she feels good.  She notes a little sore throat after surgery.  She denies any other complaints.  Her first menses was at age 32.  She has had no pregnancies.  She has not been on birth control pills.  Her last menstrual period was before Christmas.    She  has a family history of breast cancer in her maternal grandmother (diagnosed age 71).  She notes no family history of colon cancer.   Past Medical History:  Diagnosis Date  . Anxiety   . Breast cancer, left [facility])    dx 09/ 2020 by bx with atypical ductal hyperplasia/ papillnoma;   s/p  lefft breast excsional bx 05-21-2019 ,  dx DCIS    Past Surgical History:  Procedure Laterality Date  . RADIOACTIVE SEED GUIDED EXCISIONAL BREAST BIOPSY Left 05/21/2019   Procedure: LEFT RADIOACTIVE SEED GUIDED EXCISIONAL BREAST BIOPSY;  Surgeon: Stark Klein, MD;  Location: [place];  Service: General;  Laterality: Left;  . RE-EXCISION OF BREAST LUMPECTOMY Left 06/19/2019   Procedure: RE-EXCISION OF LEFT BREAST LUMPECTOMY;  Surgeon: Stark Klein, MD;  Location: [place];  Service: General;  Laterality: Left;    Family History  Problem Relation Age of Onset  . Stroke Mother   . Cancer Maternal Grandmother   . Breast cancer Maternal Grandmother 29    Social History:  reports that she has been smoking cigarettes. She has a 10.00 pack-year smoking history. She has never used smokeless tobacco. She reports current alcohol use. She reports that she does not use drugs.  She smoked 1/2 pack/day x 20 years.  She occasionally drinks alcohol.  Sge denies any exposure to radiation or toxins.  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  The patient is alone today.  Allergies: No Known Allergies  Current Medications: No  current outpatient medications on file.   No current facility-administered medications for this visit.    Review of Systems  Constitutional: Negative.  Negative for chills, diaphoresis, fever, malaise/fatigue and weight loss.       Feels "good".  HENT: Negative.  Negative for congestion, ear pain, hearing loss, nosebleeds, sinus pain and sore throat.        Slight sore throat after surgery.  Eyes: Negative.  Negative for blurred  vision, double vision and photophobia.  Respiratory: Negative.  Negative for cough, hemoptysis, sputum production and shortness of breath.   Cardiovascular: Negative.  Negative for chest pain, palpitations, orthopnea and leg swelling.  Gastrointestinal: Negative.  Negative for abdominal pain, blood in stool, constipation, diarrhea, heartburn, melena, nausea and vomiting.  Genitourinary: Negative.  Negative for dysuria, frequency, hematuria and urgency.  Musculoskeletal: Negative.  Negative for back pain, joint pain, myalgias and neck pain.  Skin: Negative.  Negative for itching and rash.  Neurological: Negative.  Negative for dizziness, tingling, tremors, sensory change, speech change, focal weakness, weakness and headaches.  Endo/Heme/Allergies: Negative.  Does not bruise/bleed easily.  Psychiatric/Behavioral: Negative for depression and memory loss. The patient is nervous/anxious. The patient does not have insomnia.    Performance status (ECOG): 0  Vitals Blood pressure 120/81, pulse (!) 120, temperature 98.8 F (37.1 C), temperature source Oral, resp. rate 16, height 5\' 2"  (1.575 m), weight 120 lb 0.7 oz (54.5 kg), last menstrual period 06/05/2019, SpO2 100 %.   Physical Exam  Constitutional: She is oriented to person, place, and time. She appears well-developed and well-nourished. No distress.  HENT:  Head: Atraumatic.  Mouth/Throat: Oropharynx is clear and moist. No oropharyngeal exudate.  Brown hair pulled back.  Eyes: Pupils are equal, round, and reactive to light. Conjunctivae and EOM are normal. No scleral icterus.  Neck: No JVD present.  Cardiovascular: Normal rate and normal heart sounds. Exam reveals no gallop.  No murmur heard. Pulmonary/Chest: Effort normal and breath sounds normal. No respiratory distress. She has no wheezes. She has no rales. Right breast exhibits no inverted nipple, no mass and no nipple discharge. Left breast exhibits tenderness (slight). Left breast  exhibits no inverted nipple, no mass and no nipple discharge.  Fibrocystic changes superiorly in the left breast.  Inferior to nipple steri-strip with post operative changes.  Right breast with scattered fibrocystic changes.  Abdominal: Soft. Bowel sounds are normal. She exhibits no distension and no mass. There is no abdominal tenderness. There is no rebound and no guarding.  Musculoskeletal:        General: No edema. Normal range of motion.     Cervical back: Normal range of motion and neck supple.  Lymphadenopathy:       Head (right side): No preauricular, no posterior auricular and no occipital adenopathy present.       Head (left side): No preauricular, no posterior auricular and no occipital adenopathy present.    She has no cervical adenopathy.    She has no axillary adenopathy.       Right: No inguinal and no supraclavicular adenopathy present.       Left: No inguinal and no supraclavicular adenopathy present.  Neurological: She is alert and oriented to person, place, and time.  Skin: Skin is warm and dry. No rash noted. She is not diaphoretic. No erythema. No pallor.  Psychiatric: She has a normal mood and affect. Her behavior is normal. Judgment and thought content normal.  Nursing note and vitals reviewed.   No  visits with results within 3 Day(s) from this visit.  Latest known visit with results is:  Admission on 06/19/2019, Discharged on 06/19/2019  Component Date Value Ref Range Status  . Preg Test, Ur 06/19/2019 NEGATIVE  NEGATIVE Final   Comment:        THE SENSITIVITY OF THIS METHODOLOGY IS >24 mIU/mL     Assessment:  Danielle Bishop is a 49 y.o. female with left breast DCIS s/p excisional biopsy on 05/21/2019.  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  The posterior/inferior margin was focally positive.  DCIS was present < 1 mm from the superior, medial and lateral margins.  Stage was pTispNX.   DCIS was ER+ (95%) and PR+ (100%).  Re-excision on  06/19/2019 is pending.  Bilateral mammogram with left axillary ultrasound on 02/14/2019 revealed duct ectasia in the 6-7 o'clock location of the LEFT breast without identifiable intraductal mass.  There was a solid 0.7 x 0.5 x 0.7 cm mass in the 7 o'clock location 2 cm from the nipple in the LEFT breast. There was an enlarged LEFT axillary lymph node.  There was a single duct spontaneous LEFT nipple discharge.  She has a family history of breast cancer (maternal grandmother age 71).  Symptomatically, she feels good.  Exam reveals post-operative changes.  Plan: 1.   Left breast DCIS  Discuss initial pathology revealing grade II DCIS with focally positive margin.  Patient is s/p re-excision with pathology pending.  Await final pathology to ensure margins are negative.  Anticipate radiation followed by endocrine therapy x 5 years.  Discuss rationale for endocrine therapy for stage 0 breast cancer:   NSABP B-24:  1800 patients randomized between tamoxifen vs placebo in patients s/p BCT and radiation.      At 13.6 years, patients who received tamoxifen had a 3.4% reduction on ipsilateral recurrence and a 3.2% reduction in contralateral breast cancer.      10 year risk of invasive cancer in the ipsilateral breast was 4.6% in the tamoxifen group vs 7.3% placebo group.    10 year risk of 5.6% non-invasive ipsilateral breast cancer was 5.6% in the tamoxifen group vs 7.2% in the placebo group.      10 year risk in the contralateral breast of invasive and non-invasive breast cancer was 4.7% in the tamoxifen group vs 6.9% in the placebo group.   NSABP B-35: 3100 patients randomized between tamoxifen vs Arimidex in patients s/p BCT with radiation.     Arimidex resulted in an improvement in breast cancer-free interval compared to tamoxifen (HR 0.73).     Ten year breast cancer free interval was 89% in the tamoxifen group and 93% in the Arimidex group.      Benefit of Arimidex was mostly seen in women < 20  years old.  2.   Family history of breast cancer  Patient 59 with family history of breast cancer in a maternal grandmother at age 49.  Referral to genetics.  Patient in agreement. 3.   Follow-up pathology from 06/19/2019. 4.   Present at tumor board on 07/08/2019. 5.   Radiation oncology referral. 6.   RTC in 1 month for MD assessment (Doximity) and discussion regarding direction of therapy.  I discussed the assessment and treatment plan with the patient.  The patient was provided an opportunity to ask questions and all were answered.  The patient agreed with the plan and demonstrated an understanding of the instructions.  The patient was advised to call  back if the symptoms worsen or if the condition fails to improve as anticipated.   Ismahan Lippman C. Mike Gip, MD, PhD    06/24/2019, 2:06 PM

## 2019-06-24 ENCOUNTER — Encounter: Payer: Self-pay | Admitting: Hematology and Oncology

## 2019-06-24 ENCOUNTER — Other Ambulatory Visit: Payer: Self-pay

## 2019-06-24 ENCOUNTER — Inpatient Hospital Stay: Payer: 59 | Attending: Hematology and Oncology | Admitting: Hematology and Oncology

## 2019-06-24 VITALS — BP 120/81 | HR 88 | Temp 98.8°F | Resp 16 | Ht 62.0 in | Wt 120.0 lb

## 2019-06-24 DIAGNOSIS — F17210 Nicotine dependence, cigarettes, uncomplicated: Secondary | ICD-10-CM | POA: Insufficient documentation

## 2019-06-24 DIAGNOSIS — D0512 Intraductal carcinoma in situ of left breast: Secondary | ICD-10-CM | POA: Insufficient documentation

## 2019-06-24 DIAGNOSIS — Z803 Family history of malignant neoplasm of breast: Secondary | ICD-10-CM | POA: Insufficient documentation

## 2019-06-24 DIAGNOSIS — Z170 Estrogen receptor positive status [ER+]: Secondary | ICD-10-CM

## 2019-06-24 LAB — SURGICAL PATHOLOGY

## 2019-06-24 NOTE — Progress Notes (Signed)
Patient here as new patient referral from Dr. Ronnald Ramp with DCIS. Denies any concerns.

## 2019-06-26 ENCOUNTER — Telehealth: Payer: Self-pay | Admitting: General Surgery

## 2019-06-26 NOTE — Telephone Encounter (Signed)
Left message on machine.  Will call back tomorrow.  Discussed specimen with pathology.  Apparently in transport, the ink got on all the surfaces of the specimens, making it unhelpful in delineating which margin was which.  So it is impossible to know which margin is positive.

## 2019-06-27 ENCOUNTER — Telehealth: Payer: Self-pay | Admitting: General Surgery

## 2019-06-27 NOTE — Telephone Encounter (Signed)
Left message on machine asking patient to call back today before 5 or tomorrow between 9 and 1 if she is available.

## 2019-07-01 ENCOUNTER — Other Ambulatory Visit: Payer: Self-pay | Admitting: General Surgery

## 2019-07-01 ENCOUNTER — Other Ambulatory Visit (HOSPITAL_COMMUNITY): Payer: Self-pay | Admitting: General Surgery

## 2019-07-03 ENCOUNTER — Other Ambulatory Visit: Payer: Self-pay | Admitting: *Deleted

## 2019-07-04 ENCOUNTER — Other Ambulatory Visit: Payer: Self-pay | Admitting: General Surgery

## 2019-07-04 DIAGNOSIS — D0510 Intraductal carcinoma in situ of unspecified breast: Secondary | ICD-10-CM

## 2019-07-08 ENCOUNTER — Other Ambulatory Visit: Payer: Self-pay | Admitting: Hematology and Oncology

## 2019-07-08 ENCOUNTER — Encounter: Payer: Self-pay | Admitting: *Deleted

## 2019-07-08 DIAGNOSIS — D0512 Intraductal carcinoma in situ of left breast: Secondary | ICD-10-CM

## 2019-07-08 NOTE — Progress Notes (Signed)
I have left patient a message to return my call.  I would like to cancel her consult with Dr. Baruch Gouty tomorrow and schedule patient to be seen by Dr. Mike Gip.

## 2019-07-08 NOTE — Progress Notes (Signed)
Patient returned my call.  She is aware that Dr. Mike Gip has cancelled her consult with Dr. Baruch Gouty and would like to see her tomorrow.  She is scheduled for 07/06/19 @ 9:45 with Dr. Mike Gip.  Patient is agreeable to the plan.  States she is about to see her surgeon this afternoon.

## 2019-07-08 NOTE — Progress Notes (Signed)
[facility]  [address], [address] [place], [place] [address] Phone: [phone]  Fax: [phone]   Clinic Day:  07/09/2019  Referring physician: Juline Patch, MD  Chief Complaint: Danielle Bishop is a 49 y.o. 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   HPI:  The patient was last seen in the medical oncology clinic on 06/24/2019 for initial consultation.  She had undergone excisional biopsy on 05/21/2019.  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  The posterior/inferior margin was focally positive.  She underwent re-excision on 06/19/2019.  Pathology was pending.  We discussed adjuvant endocrine therapy (tamoxifen).  Re-excision pathology from the left posterior margin on 06/19/2019 revealed a focus of residual DCIS and atypical lobular hyperplasia/adenosis.  There was residual DCIS in the left inferior margin re-excision.  The foci of residual DCIS from the posterior and inferior margin were present at the cauterized black ink on the left posterior and inferior margins.    She was presented at tumor board on 07/08/2019.  Decision was made to pursue a breast MRI to fully evaluate the extent of DCIS prior to further excision.  During the interim, she has felt good. She notes some left breast swelling after surgery. She has mild shooting pain at the top of the left breast. She was seen by Dr. Barry Dienes on 07/08/2019. She notes her radiation oncology appointment was cancelled. Her breast MRI is scheduled for 07/16/2019. She will have a follow up with Dr. Barry Dienes on 07/30/2019.    Past Medical History:  Diagnosis Date  . Anxiety   . Breast cancer, left [facility])    dx 09/ 2020 by bx with atypical ductal hyperplasia/ papillnoma;   s/p  lefft breast excsional bx 05-21-2019 ,  dx DCIS    Past Surgical History:  Procedure Laterality Date  . RADIOACTIVE SEED  GUIDED EXCISIONAL BREAST BIOPSY Left 05/21/2019   Procedure: LEFT RADIOACTIVE SEED GUIDED EXCISIONAL BREAST BIOPSY;  Surgeon: Stark Klein, MD;  Location: [place];  Service: General;  Laterality: Left;  . RE-EXCISION OF BREAST LUMPECTOMY Left 06/19/2019   Procedure: RE-EXCISION OF LEFT BREAST LUMPECTOMY;  Surgeon: Stark Klein, MD;  Location: [place];  Service: General;  Laterality: Left;    Family History  Problem Relation Age of Onset  . Stroke Mother   . Cancer Maternal Grandmother   . Breast cancer Maternal Grandmother 41    Social History:  reports that she has been smoking cigarettes. She has a 10.00 pack-year smoking history. She has never used smokeless tobacco. She reports current alcohol use. She reports that she does not use drugs.  She smoked 1/2 pack/day x 20 years.  She occasionally drinks alcohol.  Sge denies any exposure to radiation or toxins.  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  The patient is alone today.  Allergies: No Known Allergies  Current Medications: No current outpatient medications on file.   No current facility-administered medications for this visit.   Review of Systems  Constitutional: Positive for weight loss (4 pounds). Negative for chills, diaphoresis, fever and malaise/fatigue.       Feels good.  HENT: Negative.  Negative for congestion, ear discharge, ear pain, hearing loss, nosebleeds, sinus pain and sore throat.   Eyes: Negative.  Negative for blurred vision, double vision and photophobia.  Respiratory: Negative.  Negative for cough, hemoptysis,  sputum production and shortness of breath.   Cardiovascular: Positive for chest pain (left breast; mild; shooting pain at the top). Negative for palpitations, orthopnea and leg swelling.       Mild left breast swelling.  Gastrointestinal: Negative.  Negative for abdominal pain, blood in stool, constipation, diarrhea, heartburn, melena,  nausea and vomiting.  Genitourinary: Negative.  Negative for dysuria, frequency, hematuria and urgency.  Musculoskeletal: Negative.  Negative for back pain, joint pain, myalgias and neck pain.  Skin: Negative.  Negative for itching and rash.  Neurological: Negative.  Negative for dizziness, tingling, tremors, sensory change, speech change, focal weakness, weakness and headaches.  Endo/Heme/Allergies: Negative.  Does not bruise/bleed easily.  Psychiatric/Behavioral: Negative.  Negative for depression and memory loss. The patient is not nervous/anxious and does not have insomnia.   All other systems reviewed and are negative.  Performance status (ECOG): 0  Vitals Blood pressure 116/84, pulse 71, temperature (!) 96.5 F (35.8 C), temperature source Tympanic, weight 116 lb 6.5 oz (52.8 kg), SpO2 100 %.   Physical Exam  Constitutional: She is oriented to person, place, and time and well-developed, well-nourished, and in no distress. No distress.  HENT:  Head: Normocephalic and atraumatic.  Long black hair pulled back.  Mask.  Eyes: Conjunctivae and EOM are normal. No scleral icterus.  Blue eyes.  Neurological: She is alert and oriented to person, place, and time. Gait normal.  Skin: She is not diaphoretic.  Psychiatric: Mood, memory, affect and judgment normal.  Nursing note and vitals reviewed.   No visits with results within 3 Day(s) from this visit.  Latest known visit with results is:  Admission on 06/19/2019, Discharged on 06/19/2019  Component Date Value Ref Range Status  . Preg Test, Ur 06/19/2019 NEGATIVE  NEGATIVE Final   Comment:        THE SENSITIVITY OF THIS METHODOLOGY IS >24 mIU/mL   . SURGICAL PATHOLOGY 06/19/2019    Final-Edited                   Value:SURGICAL PATHOLOGY CASE: [id number] PATIENT: Palin Fanara Surgical Pathology Report  Clinical History: Left breast cancer (cm)   FINAL MICROSCOPIC DIAGNOSIS:  A. BREAST, LEFT POSTERIOR MARGIN, RE-EXCISION: -  Focus of residual ductal carcinoma in situ. - Atypical lobular hyperplasia/adenosis. - Please see comment.  B. BREAST, LEFT INFERIOR MARGIN, RE-EXCISION: - Residual ductal carcinoma in situ. - Please see comment.  COMMENT:  A. The focus of residual DCIS is present at the cauterized black ink. CK5/6 is negative in DCIS.              E-cadherin demonstrates weak staining in atypical lobular hyperplasia.  P63, Calponin and SMM-1 demonstrate the presence of myoepithelium throughout.  B.  Focally, residual DCIS is present at the cauterized black ink. CK5/6 is negative in DCIS.  Intradepartmental consultation (Dr. Tresa Moore).  GROSS DESCRIPTION:  A. Received fresh and placed in formalin at 12 PM is a 3 x 2.6 x 1.5 cm portion of tan-yellow soft tiss                         ue, received entirely inked black. Sectioned and entirely submitted in 5 cassettes.  B.  Received fresh and placed in formalin at 12 PM is a 3.1 x 1.5 x 1 cm portion of tan-yellow soft tissue, received entirely inked blue. Sectioned and entirely submitted in 5 cassettes.  (AK 06/19/2019)  Final Diagnosis performed by Gillie Manners, MD.  Electronically signed 06/24/2019 Technical and / or Professional components performed at [employer]. [facility], [place] [address] [place], [place] [address].  Immunohistochemistry Technical component (if applicable) was performed at [facility]. [address], [place], [place], [place] [address].   IMMUNOHISTOCHEMISTRY DISCLAIMER (if applicable): Some of these immunohistochemical stains may have been developed and the performance characteristics determine by [facility]. Some may not have been cleared or approved by the U.S. Food and Drug Administration. The FDA has determined that such clear                         ance or approval is not necessary. This test is used for clinical purposes. It should not be regarded as investigational or for  research. This laboratory is certified under the [place] (CLIA-88) as qualified to perform high complexity clinical laboratory testing.  The controls stained appropriately.     Assessment:  Maplewood Mcnatt is a 49 y.o. female with left breast DCIS s/p excisional biopsy on 05/21/2019.  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  The posterior/inferior margin was focally positive.  DCIS was present < 1 mm from the superior, medial and lateral margins.  Stage was pTispNX.   DCIS was ER+ (95%) and PR+ (100%).    Re-excision on 06/19/2019 revealed a focus of residual DCIS and atypical lobular hyperplasia/adenosis at the left posterior margin.  There was residual DCIS at the left inferior margin.  The foci of residual DCIS from the posterior and inferior margin were present at the cauterized black ink on the left posterior and inferior margins.    Bilateral mammogram with left axillary ultrasound on 02/14/2019 revealed duct ectasia in the 6-7 o'clock location of the LEFT breast without identifiable intraductal mass.  There was a solid 0.7 x 0.5 x 0.7 cm mass in the 7 o'clock location 2 cm from the nipple in the LEFT breast. There was an enlarged LEFT axillary lymph node.  There was a single duct spontaneous LEFT nipple discharge.  She has a family history of breast cancer (maternal grandmother age 62).  Symptomatically, she feels good.  She has mild post-operative discomfort.  Plan: 1.   Left breast DCIS  Initial pathology revealed grade II DCIS with focally positive margin.  Re-excision pathology revealed persistent + margins (posterior and inferior).  Discuss conversation at tumor board.   Breast MRI to assess residual disease before further surgery.  Cancel radiation oncology appointment.  Anticipate future adjuvant endocrine therapy. 2.   Family history of breast cancer  Patient 51 with family history of breast cancer in a maternal  grandmother at age 72.  Patient has been referred to genetics. 3.   Breast MRI on 07/16/2019 (already scheduled). 4.   Present at tumor board on 07/22/2019. 5.   Reschedule 07/22/2019 in the afternoon following tumor board.  I provided 16 minutes of face-to-face video visit time during this this encounter and > 50% was spent counseling as documented under my assessment and plan.  I provided these services from the [facility] office.  I discussed the assessment and treatment plan with the patient.  The patient was provided an opportunity to ask questions and all were answered.  The patient agreed with the plan and demonstrated an understanding of the instructions.  The patient was advised to call back if the symptoms worsen or if the condition fails to improve as anticipated.  Lamesha Tibbits C. Mike Gip, MD, PhD    07/09/2019, 10:52 AM  I, Selena Batten, am acting as scribe for [place]. Mike Gip, MD, PhD.  I, Finnlee Silvernail C. Mike Gip, MD, have reviewed the above documentation for accuracy and completeness, and I agree with the above.

## 2019-07-09 ENCOUNTER — Encounter: Payer: Self-pay | Admitting: Hematology and Oncology

## 2019-07-09 ENCOUNTER — Ambulatory Visit: Payer: 59 | Admitting: Radiation Oncology

## 2019-07-09 ENCOUNTER — Inpatient Hospital Stay: Payer: 59 | Admitting: Hematology and Oncology

## 2019-07-09 ENCOUNTER — Other Ambulatory Visit: Payer: Self-pay

## 2019-07-09 VITALS — BP 116/84 | HR 71 | Temp 96.5°F | Wt 116.4 lb

## 2019-07-09 DIAGNOSIS — D0512 Intraductal carcinoma in situ of left breast: Secondary | ICD-10-CM

## 2019-07-09 DIAGNOSIS — Z803 Family history of malignant neoplasm of breast: Secondary | ICD-10-CM | POA: Insufficient documentation

## 2019-07-09 NOTE — Progress Notes (Signed)
No new changes noted today 

## 2019-07-16 ENCOUNTER — Other Ambulatory Visit: Payer: Self-pay

## 2019-07-16 ENCOUNTER — Ambulatory Visit
Admission: RE | Admit: 2019-07-16 | Discharge: 2019-07-16 | Disposition: A | Discharge status: Home / Self Care | Payer: 59 | Source: Ambulatory Visit | Attending: General Surgery | Admitting: General Surgery

## 2019-07-16 DIAGNOSIS — D0510 Intraductal carcinoma in situ of unspecified breast: Secondary | ICD-10-CM | POA: Diagnosis present

## 2019-07-16 MED ORDER — GADOBUTROL 1 MMOL/ML IV SOLN
5.0000 mL | Freq: Once | INTRAVENOUS | Status: AC | PRN
  Administered 2019-07-16: 09:00:00 5 mL via INTRAVENOUS

## 2019-07-17 ENCOUNTER — Other Ambulatory Visit: Payer: Self-pay | Admitting: General Surgery

## 2019-07-17 DIAGNOSIS — R9389 Abnormal findings on diagnostic imaging of other specified body structures: Secondary | ICD-10-CM

## 2019-07-18 NOTE — Progress Notes (Signed)
[facility]  [address] [place], [place] [address] Phone: [phone]  Fax: [phone]   Telemedicine Office Visit:  07/22/2019  Referring physician: Juline Patch, MD  I connected with Phil Macmillan on 07/22/2019 at 2:39 PM by videoconferencing and verified that I was speaking with the correct person using 2 identifiers.  The patient was at work.  I discussed the limitations, risk, security and privacy concerns of performing an evaluation and management service by videoconferencing and the availability of in person appointments.  I also discussed with the patient that there may be a patient responsible charge related to this service.  The patient expressed understanding and agreed to proceed.   Chief Complaint: Jeslyn Nicklow is a 49 y.o. female with left breast DCIS who is seen for 1 month assessment and discussion regarding direction of therapy.   HPI: The patient was last seen in the medical oncology clinic on 07/09/2019. At that time, she felt good. She had mild post-operative discomfort. She had undergone excision then re-excision.  Margins were positive.  We discussed a breast MRI.   Bilateral breast MRI on 07/16/2019 revealed postoperative seroma in the central inferior left breast at the site of patient's lumpectomy for DCIS.  There was non-mass enhancement was seen surrounding the lumpectomy site involving the entire inferior left breast, spanning 4.0 x 5.8 x 4.3 cm. There was suspicious linear enhancement extending from the postoperative seroma into the nipple. There was indeterminate patchy non-mass enhancement extending into the upper inner quadrant of the left breast (1.3 x 1.2 cm). There were two indeterminate enhancing masses in the upper inner and lower outer right breast. There was no suspicious lymphadenopathy.  During the interim, she has felt good.  She notes upper left breast swelling and pain.    Past Medical History:  Diagnosis Date  .  Anxiety   . Breast cancer, left [facility])    dx 09/ 2020 by bx with atypical ductal hyperplasia/ papillnoma;   s/p  lefft breast excsional bx 05-21-2019 ,  dx DCIS    Past Surgical History:  Procedure Laterality Date  . RADIOACTIVE SEED GUIDED EXCISIONAL BREAST BIOPSY Left 05/21/2019   Procedure: LEFT RADIOACTIVE SEED GUIDED EXCISIONAL BREAST BIOPSY;  Surgeon: Stark Klein, MD;  Location: [place];  Service: General;  Laterality: Left;  . RE-EXCISION OF BREAST LUMPECTOMY Left 06/19/2019   Procedure: RE-EXCISION OF LEFT BREAST LUMPECTOMY;  Surgeon: Stark Klein, MD;  Location: [place];  Service: General;  Laterality: Left;    Family History  Problem Relation Age of Onset  . Stroke Mother   . Cancer Maternal Grandmother   . Breast cancer Maternal Grandmother 74    Social History:  reports that she has been smoking cigarettes. She has a 10.00 pack-year smoking history. She has never used smokeless tobacco. She reports current alcohol use. She reports that she does not use drugs.  She smoked 1/2 pack/day x 20 years.  She occasionally drinks alcohol.  Sge denies any exposure to radiation or toxins.  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  Participants in the patient's visit and their role in the encounter included the patient and Waymon Budge, RN, today.  The intake visit was provided by [facility], R.  Allergies: No Known Allergies  Current Medications: No current outpatient medications on file.   No current facility-administered medications for this visit.    Review of  Systems  Constitutional: Negative for chills, diaphoresis, fever, malaise/fatigue and weight loss.       Feels good.  HENT: Negative for congestion, hearing loss, nosebleeds, sinus pain and sore throat.   Eyes: Negative for blurred vision.  Respiratory: Negative for cough, hemoptysis, sputum production and shortness  of breath.   Cardiovascular: Positive for chest pain (upper left breast pain). Negative for palpitations and leg swelling.       Left breast swelling.  Gastrointestinal: Negative for abdominal pain, blood in stool, constipation, diarrhea, heartburn, melena, nausea and vomiting.  Genitourinary: Negative for dysuria, frequency, hematuria and urgency.  Musculoskeletal: Negative for back pain, joint pain, myalgias and neck pain.  Skin: Negative for itching and rash.  Neurological: Negative for dizziness, tingling, sensory change, weakness and headaches.  Endo/Heme/Allergies: Does not bruise/bleed easily.  Psychiatric/Behavioral: Negative for depression and memory loss. The patient is not nervous/anxious and does not have insomnia.   All other systems reviewed and are negative.  Performance status (ECOG): 0-1  Physical Exam  Constitutional: She is oriented to person, place, and time. She appears well-developed and well-nourished. No distress.  HENT:  Head: Normocephalic.  Long black hair pulled back.  Eyes: Conjunctivae and EOM are normal. No scleral icterus.  Neurological: She is alert and oriented to person, place, and time.  Skin: She is not diaphoretic.  Psychiatric: She has a normal mood and affect. Her behavior is normal. Judgment and thought content normal.  Nursing note reviewed.   No visits with results within 3 Day(s) from this visit.  Latest known visit with results is:  Admission on 06/19/2019, Discharged on 06/19/2019  Component Date Value Ref Range Status  . Preg Test, Ur 06/19/2019 NEGATIVE  NEGATIVE Final   Comment:        THE SENSITIVITY OF THIS METHODOLOGY IS >24 mIU/mL   . SURGICAL PATHOLOGY 06/19/2019    Final-Edited                   Value:SURGICAL PATHOLOGY CASE: [id number] PATIENT: Malaney Vint Surgical Pathology Report  Clinical History: Left breast cancer (cm)   FINAL MICROSCOPIC DIAGNOSIS:  A. BREAST, LEFT POSTERIOR MARGIN, RE-EXCISION: - Focus of  residual ductal carcinoma in situ. - Atypical lobular hyperplasia/adenosis. - Please see comment.  B. BREAST, LEFT INFERIOR MARGIN, RE-EXCISION: - Residual ductal carcinoma in situ. - Please see comment.  COMMENT:  A. The focus of residual DCIS is present at the cauterized black ink. CK5/6 is negative in DCIS.              E-cadherin demonstrates weak staining in atypical lobular hyperplasia.  P63, Calponin and SMM-1 demonstrate the presence of myoepithelium throughout.  B.  Focally, residual DCIS is present at the cauterized black ink. CK5/6 is negative in DCIS.  Intradepartmental consultation (Dr. Tresa Moore).  GROSS DESCRIPTION:  A. Received fresh and placed in formalin at 12 PM is a 3 x 2.6 x 1.5 cm portion of tan-yellow soft tiss                         ue, received entirely inked black. Sectioned and entirely submitted in 5 cassettes.  B.  Received fresh and placed in formalin at 12 PM is a 3.1 x 1.5 x 1 cm portion of tan-yellow soft tissue, received entirely inked blue. Sectioned and entirely submitted in 5 cassettes.  (AK 06/19/2019)   Final Diagnosis performed by Gillie Manners, MD.   Electronically signed 06/24/2019 Technical and /  or Professional components performed at [employer]. [facility], [place] [address], [place], [place] [address].  Immunohistochemistry Technical component (if applicable) was performed at [facility]. [address] [place], [place], [place] [address].   IMMUNOHISTOCHEMISTRY DISCLAIMER (if applicable): Some of these immunohistochemical stains may have been developed and the performance characteristics determine by [facility]. Some may not have been cleared or approved by the U.S. Food and Drug Administration. The FDA has determined that such clear                         ance or approval is not necessary. This test is used for clinical purposes. It should not be regarded as investigational or for  research. This laboratory is certified under the [place] (CLIA-88) as qualified to perform high complexity clinical laboratory testing.  The controls stained appropriately.    Assessment:  Camya Choudhury is a 49 y.o. female with left breast DCIS s/p excisional biopsy on 05/21/2019.  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  The posterior/inferior margin was focally positive.  DCIS was present < 1 mm from the superior, medial and lateral margins.  Stage was pTispNX.   DCIS was ER+ (95%) and PR+ (100%).    Re-excision on 06/19/2019 revealed a focus of residual DCIS and atypical lobular hyperplasia/adenosis at the left posterior margin.  There was residual DCIS at the left inferior margin.  The foci of residual DCIS from the posterior and inferior margin were present at the cauterized black ink on the left posterior and inferior margins.    Bilateral mammogram with left axillary ultrasound on 02/14/2019 revealed duct ectasia in the 6-7 o'clock location of the LEFT breast without identifiable intraductal mass.  There was a solid 0.7 x 0.5 x 0.7 cm mass in the 7 o'clock location 2 cm from the nipple in the LEFT breast. There was an enlarged LEFT axillary lymph node.  There was a single duct spontaneous LEFT nipple discharge.  Bilateral breast MRI on 07/16/2019 revealed postoperative seroma in the central inferior left breast at the site of patient's lumpectomy for DCIS.  There was non-mass enhancement was seen surrounding the lumpectomy site involving the entire inferior left breast, spanning 4.0 x 5.8 x 4.3 cm. There was suspicious linear enhancement extending from the postoperative seroma into the nipple. There was indeterminate patchy non-mass enhancement extending into the upper inner quadrant of the left breast (1.3 x 1.2 cm). There were two indeterminate enhancing masses in the upper inner and lower outer right breast. There was no  suspicious lymphadenopathy.  She has a family history of breast cancer (maternal grandmother age 85).  Symptomatically, she notes upper left breast swelling and pain.    Plan: 1.   Left breast DCIS             Initial pathology revealed grade II DCIS with focally positive margin.             Re-excision pathology revealed persistent + margins (posterior and inferior).             Breast MRI revealed additional areas of enhancement.  Discuss plan for MRI guided biopsies   Left breast biopsies on 07/26/2019.   Right breast biopsy on 07/29/2019.  Await results of biopsies to determine extent of surgery.  Tumor board on 08/05/2019.  2.   Family history of breast cancer  Patient 66 with family history of breast cancer in a maternal grandmother at age 75.             Patient was referred to genetics. 3.   RTC on 08/02/2019 for MD assessment and review of biopsy results.  I discussed the assessment and treatment plan with the patient.  The patient was provided an opportunity to ask questions and all were answered.  The patient agreed with the plan and demonstrated an understanding of the instructions.  The patient was advised to call back if the symptoms worsen or if the condition fails to improve as anticipated.  I provided 10 minutes (2:39 PM - 2:49 PM) of face-to-face video visit time during this this encounter and > 50% was spent counseling as documented under my assessment and plan.  I provided these services from the [facility] office.   Lequita Asal, MD, PhD    07/22/2019, 2:39 PM  I, Selena Batten, am acting as scribe for [employer]. Mike Gip, MD, PhD.  I, Irelynd Zumstein C. Mike Gip, MD, have reviewed the above documentation for accuracy and completeness, and I agree with the above.

## 2019-07-22 ENCOUNTER — Inpatient Hospital Stay: Payer: 59

## 2019-07-22 ENCOUNTER — Encounter: Payer: Self-pay | Admitting: Hematology and Oncology

## 2019-07-22 ENCOUNTER — Inpatient Hospital Stay: Payer: 59 | Attending: Hematology and Oncology | Admitting: Hematology and Oncology

## 2019-07-22 ENCOUNTER — Ambulatory Visit: Payer: 59 | Admitting: Hematology and Oncology

## 2019-07-22 DIAGNOSIS — Z803 Family history of malignant neoplasm of breast: Secondary | ICD-10-CM | POA: Diagnosis not present

## 2019-07-22 DIAGNOSIS — D0512 Intraductal carcinoma in situ of left breast: Secondary | ICD-10-CM | POA: Diagnosis not present

## 2019-07-22 NOTE — Progress Notes (Signed)
Confirmed Name and DOB. Denies any concerns.  

## 2019-07-26 ENCOUNTER — Ambulatory Visit
Admission: RE | Admit: 2019-07-26 | Discharge: 2019-07-26 | Disposition: A | Discharge status: Home / Self Care | Payer: 59 | Source: Ambulatory Visit | Attending: General Surgery | Admitting: General Surgery

## 2019-07-26 ENCOUNTER — Other Ambulatory Visit: Payer: Self-pay

## 2019-07-26 ENCOUNTER — Other Ambulatory Visit (HOSPITAL_COMMUNITY): Payer: Self-pay | Admitting: Diagnostic Radiology

## 2019-07-26 DIAGNOSIS — R9389 Abnormal findings on diagnostic imaging of other specified body structures: Secondary | ICD-10-CM

## 2019-07-26 MED ORDER — GADOBUTROL 1 MMOL/ML IV SOLN
6.0000 mL | Freq: Once | INTRAVENOUS | Status: AC | PRN
  Administered 2019-07-26: 6 mL via INTRAVENOUS

## 2019-07-29 ENCOUNTER — Ambulatory Visit
Admission: RE | Admit: 2019-07-29 | Discharge: 2019-07-29 | Disposition: A | Discharge status: Home / Self Care | Payer: 59 | Source: Ambulatory Visit | Attending: General Surgery | Admitting: General Surgery

## 2019-07-29 ENCOUNTER — Other Ambulatory Visit (HOSPITAL_COMMUNITY): Payer: Self-pay | Admitting: Diagnostic Radiology

## 2019-07-29 ENCOUNTER — Other Ambulatory Visit: Payer: Self-pay

## 2019-07-29 ENCOUNTER — Other Ambulatory Visit: Payer: Self-pay | Admitting: General Surgery

## 2019-07-29 DIAGNOSIS — R9389 Abnormal findings on diagnostic imaging of other specified body structures: Secondary | ICD-10-CM

## 2019-07-29 MED ORDER — GADOBUTROL 1 MMOL/ML IV SOLN
6.0000 mL | Freq: Once | INTRAVENOUS | Status: AC | PRN
  Administered 2019-07-29: 6 mL via INTRAVENOUS

## 2019-08-01 DIAGNOSIS — N6091 Unspecified benign mammary dysplasia of right breast: Secondary | ICD-10-CM | POA: Insufficient documentation

## 2019-08-01 NOTE — Progress Notes (Addendum)
[facility]  [address] [place], [place] [address] Phone: [phone]   Telemedicine Office Visit:  08/02/2019  Referring physician: Juline Patch, MD  I connected with Danielle Bishop on 08/02/19 at 8:26 AM by videoconferencing and verified that I was speaking with the correct person using 2 identifiers.  The patient was at home.  I discussed the limitations, risk, security and privacy concerns of performing an evaluation and management service by videoconferencing and the availability of in person appointments.  I also discussed with the patient that there may be a patient responsible charge related to this service.  The patient expressed understanding and agreed to proceed.   Chief Complaint: Danielle Bishop is a 49 y.o. 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  HPI: The patient was last seen in the medical oncology clinic on 07/22/2019 via telemedicine.  At that time, she noted upper left breast pain and swelling s/p re-excision.  We discussed pathology which revealed persistent positive margins (posterior and inferior).  Breast MRI had revealed additional areas of enhancement for which MRI guided biopsies were recommended.  Left breast biopsy on 07/26/2019 revealed fibrocystic changes in the upper inner quadrant and intermediate grade DCIS in the lower outer quadrant. DCIS was ER+( 95%) and PR+ (100%).  Right breast biopsy on 07/29/2019 revealed atypical lobular hyperplasia, findings suggestive of lipoma, and fibrocystic changes in lower outer quadrant.   During the interim, she has had pain at her biopsy incision sites, but overall is doing alright.  She describes some bruising and stiffness.   She has had to reschedule with her surgeon due to weather and her work schedule. She is to meet with her surgeon next Friday.  She has not been contacted by the Retail buyer.   Past Medical History:   Diagnosis Date  . Anxiety   . Breast cancer, left [facility])    dx 09/ 2020 by bx with atypical ductal hyperplasia/ papillnoma;   s/p  lefft breast excsional bx 05-21-2019 ,  dx DCIS    Past Surgical History:  Procedure Laterality Date  . RADIOACTIVE SEED GUIDED EXCISIONAL BREAST BIOPSY Left 05/21/2019   Procedure: LEFT RADIOACTIVE SEED GUIDED EXCISIONAL BREAST BIOPSY;  Surgeon: Stark Klein, MD;  Location: [place] [facility];  Service: General;  Laterality: Left;  . RE-EXCISION OF BREAST LUMPECTOMY Left 06/19/2019   Procedure: RE-EXCISION OF LEFT BREAST LUMPECTOMY;  Surgeon: Stark Klein, MD;  Location: [place];  Service: General;  Laterality: Left;    Family History  Problem Relation Age of Onset  . Stroke Mother   . Cancer Maternal Grandmother   . Breast cancer Maternal Grandmother 71    Social History:  reports that she has been smoking cigarettes. She has a 10.00 pack-year smoking history. She has never used smokeless tobacco. She reports current alcohol use. She reports that she does not use drugs.  She smoked 1/2 pack/day x 20 years.  She occasionally drinks alcohol.  Sge denies any exposure to radiation or toxins.  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 The patient is alone today.  Participants in the patient's visit and their role in the encounter included the patient, Park Meo, and Waymon Budge, RN or [employer], [place], today.  The intake visit was provided by Samul Dada, scribe, Waymon Budge, RN or Vito Berger, [place].    Allergies: No Known Allergies  Current  Medications: No current outpatient medications on file.   No current facility-administered medications for this visit.    Review of Systems  Constitutional: Negative.  Negative for chills, diaphoresis, fever, malaise/fatigue and weight loss (no new weight).       Feels "better".  HENT: Negative.  Negative for congestion, hearing  loss, nosebleeds, sinus pain and sore throat.   Eyes: Negative.  Negative for blurred vision and double vision.  Respiratory: Negative.  Negative for cough, sputum production and shortness of breath.   Cardiovascular: Positive for chest pain (upper left breast pain). Negative for palpitations and leg swelling.       Left breast swelling.  Gastrointestinal: Negative.  Negative for abdominal pain, blood in stool, constipation, diarrhea, heartburn, melena, nausea and vomiting.  Genitourinary: Negative.  Negative for dysuria, frequency, hematuria and urgency.  Musculoskeletal: Negative.  Negative for back pain, joint pain, myalgias and neck pain.  Skin: Negative.  Negative for itching and rash.  Neurological: Negative.  Negative for dizziness, tingling, sensory change, weakness and headaches.  Endo/Heme/Allergies: Bruises/bleeds easily (bruising s/p biopsy).  Psychiatric/Behavioral: Negative.  Negative for depression and memory loss. The patient is not nervous/anxious and does not have insomnia.   All other systems reviewed and are negative.  Performance status (ECOG): 0-1  Physical Exam  Constitutional: She is oriented to person, place, and time. She appears well-developed and well-nourished. No distress.  HENT:  Head: Normocephalic and atraumatic.  Long dark hair.  Eyes: Conjunctivae and EOM are normal. No scleral icterus.  Neurological: She is alert and oriented to person, place, and time.  Skin: She is not diaphoretic.  Psychiatric: She has a normal mood and affect. Her behavior is normal. Judgment and thought content normal.  Nursing note reviewed.   No visits with results within 3 Day(s) from this visit.  Latest known visit with results is:  Admission on 06/19/2019, Discharged on 06/19/2019  Component Date Value Ref Range Status  . Preg Test, Ur 06/19/2019 NEGATIVE  NEGATIVE Final   Comment:        THE SENSITIVITY OF THIS METHODOLOGY IS >24 mIU/mL   . SURGICAL PATHOLOGY  06/19/2019    Final-Edited                   Value:SURGICAL PATHOLOGY CASE: [id number] PATIENT: Danielle Bishop Surgical Pathology Report  Clinical History: Left breast cancer (cm)  FINAL MICROSCOPIC DIAGNOSIS:  A. BREAST, LEFT POSTERIOR MARGIN, RE-EXCISION: - Focus of residual ductal carcinoma in situ. - Atypical lobular hyperplasia/adenosis. - Please see comment.  B. BREAST, LEFT INFERIOR MARGIN, RE-EXCISION: - Residual ductal carcinoma in situ. - Please see comment.  COMMENT:  A. The focus of residual DCIS is present at the cauterized black ink. CK5/6 is negative in DCIS.              E-cadherin demonstrates weak staining in atypical lobular hyperplasia.  P63, Calponin and SMM-1 demonstrate the presence of myoepithelium throughout.  B.  Focally, residual DCIS is present at the cauterized black ink. CK5/6 is negative in DCIS.  Intradepartmental consultation (Dr. Tresa Moore).  GROSS DESCRIPTION:  A. Received fresh and placed in formalin at 12 PM is a 3 x 2.6 x 1.5 cm portion of tan-yellow soft tiss                         ue, received entirely inked black. Sectioned and entirely submitted in 5 cassettes.  B.  Received fresh and placed in formalin at  12 PM is a 3.1 x 1.5 x 1 cm portion of tan-yellow soft tissue, received entirely inked blue. Sectioned and entirely submitted in 5 cassettes.  (AK 06/19/2019)  Final Diagnosis performed by Gillie Manners, MD.   Electronically signed 06/24/2019 Technical and / or Professional components performed at [facility]. [facility], [place] [address] [place], [place] [address].  Immunohistochemistry Technical component (if applicable) was performed at [facility]. [address] [place], [place], [place] [address].   IMMUNOHISTOCHEMISTRY DISCLAIMER (if applicable): Some of these immunohistochemical stains may have been developed and the performance characteristics determine by [facility]. Some may  not have been cleared or approved by the U.S. Food and Drug Administration. The FDA has determined that such clear                         ance or approval is not necessary. This test is used for clinical purposes. It should not be regarded as investigational or for research. This laboratory is certified under the [place] (CLIA-88) as qualified to perform high complexity clinical laboratory testing.  The controls stained appropriately.    Assessment:  Danielle Bishop is a 49 y.o. female with left breast DCIS s/p excisional biopsy on 05/21/2019.  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  The posterior/inferior margin was focally positive.  DCIS was present < 1 mm from the superior, medial and lateral margins.  Stage was pTispNX.   DCIS was ER+ (95%) and PR+ (100%).    Re-excision on 06/19/2019 revealed a focus of residual DCIS and atypical lobular hyperplasia/adenosis at the left posterior margin.  There was residual DCIS at the left inferior margin.  The foci of residual DCIS from the posterior and inferior margin were present at the cauterized black ink on the left posterior and inferior margins.    Bilateral mammogram with left axillary ultrasound on 02/14/2019 revealed duct ectasia in the 6-7 o'clock location of the LEFT breast without identifiable intraductal mass.  There was a solid 0.7 x 0.5 x 0.7 cm mass in the 7 o'clock location 2 cm from the nipple in the LEFT breast. There was an enlarged LEFT axillary lymph node.  There was a single duct spontaneous LEFT nipple discharge.  Bilateral breast MRI on 07/16/2019 revealed postoperative seroma in the central inferior left breast at the site of patient's lumpectomy for DCIS. Non-mass enhancement was seen surrounding the lumpectomy site involving the entire inferior left breast, spanning 4.0 x 5.8 x 4.3 cm. There was suspicious linear enhancement extending from the postoperative  seroma into the nipple. There was indeterminate patchy non-mass enhancement extending into the upper inner quadrant (UIQ) of the left breast (1.3 x 1.2 cm). There were two indeterminate enhancing masses in the upper inner and lower outer right breast. There was no suspicious lymphadenopathy.  Left breast biopsy on 07/26/2019 revealed fibrocystic changes in the upper inner quadrant (UIQ) and intermediate grade DCIS in the lower outer quadrant (LOQ). DCIS was ER+ (95%) and PR+ (100%).  Right breast biopsy on 07/29/2019 revealed atypical lobular hyperplasia, findings suggestive of lipoma, and fibrocystic changes in lower outer quadrant (LOQ).   She has a family history of breast cancer (maternal grandmother age 57).  Symptomatically, she is doing well s/p biopsies  Plan: 1.   Left breast DCIS             Initial pathology revealed grade II DCIS  with focally positive margin.             Re-excision pathology revealed persistent + margins (posterior and inferior).             Results of left and right MRI guided breast biopsies were reviewed.              There was intermediate grade DCIS in the left lower outer quadrant.   There was atypical lobular hyperplasia in the right breast.   The lesion in the upper inner quadrant of the right breast was located too posteriorly to biopsy (a clip was placed).  We discussed plans for surgery.   Given the extent of DCIS in the left breast, unclear if she will be able to under lumpectomy.   We discussed local excision in the right breast including the lesion posteriorly where the clip was placed.  Discuss with Dr Stark Klein. 2.   Family history of breast cancer             Patient 7 with family history of breast cancer in a maternal grandmother at age 62.             She has been referred to genetics. 3.  Tumor board on 08/05/2019. 4.   MD to call patient after tumor board. 5.   RTC 2 weeks after surgery (patient to call) to review final pathology and  direction of therapy.  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  The patient was provided an opportunity to ask questions and all were answered.  The patient agreed with the plan and demonstrated an understanding of the instructions.  The patient was advised to call back if the symptoms worsen or if the condition fails to improve as anticipated.  I provided 11 minutes (8:26 AM - 8:37 AM) of face-to-face video visit time during this this encounter and > 50% was spent counseling as documented under my assessment and plan.  I provided these services from home due to inclement weather .   Lequita Asal, MD, PhD    08/02/2019, 8:37 AM  I, Samul Dada, am acting as a scribe for Lequita Asal, MD.  I, Milnor Mike Gip, MD, have reviewed the above documentation for accuracy and completeness, and I agree with the above.

## 2019-08-01 NOTE — Progress Notes (Signed)
Confirmed Name and DOB. Denies any concerns at this time. Reports she is healing well from biopsy.

## 2019-08-02 ENCOUNTER — Encounter: Payer: Self-pay | Admitting: Hematology and Oncology

## 2019-08-02 ENCOUNTER — Inpatient Hospital Stay (HOSPITAL_BASED_OUTPATIENT_CLINIC_OR_DEPARTMENT_OTHER): Payer: 59 | Admitting: Hematology and Oncology

## 2019-08-02 DIAGNOSIS — N6091 Unspecified benign mammary dysplasia of right breast: Secondary | ICD-10-CM

## 2019-08-02 DIAGNOSIS — D0512 Intraductal carcinoma in situ of left breast: Secondary | ICD-10-CM | POA: Diagnosis not present

## 2019-08-07 ENCOUNTER — Telehealth: Payer: Self-pay | Admitting: Hematology and Oncology

## 2019-08-07 NOTE — Telephone Encounter (Signed)
Re:  Tumor board discussions  Unable to reach patient by phone to review tumor board discussions.  Message left.  Will call her tomorrow.   Lequita Asal, MD

## 2019-08-08 ENCOUNTER — Encounter: Payer: Self-pay | Admitting: *Deleted

## 2019-08-08 NOTE — Progress Notes (Signed)
Patient called and wanted to know if we had discussed her case in case conference.  She was eager to know the outcome.   Discussed with Dr. Mike Gip and she will  Call the patient back today.

## 2019-08-13 ENCOUNTER — Inpatient Hospital Stay: Payer: 59 | Attending: Hematology and Oncology

## 2019-08-13 ENCOUNTER — Other Ambulatory Visit: Payer: Self-pay

## 2019-08-16 ENCOUNTER — Ambulatory Visit: Payer: 59 | Attending: Internal Medicine

## 2019-08-16 DIAGNOSIS — Z23 Encounter for immunization: Secondary | ICD-10-CM

## 2019-08-16 NOTE — Progress Notes (Signed)
   Covid-19 Vaccination Clinic  Name:  Rosabelle Jagers    MRN: [id number] DOB: April 15, 1971  08/16/2019  Ms. Sarti was observed post Covid-19 immunization for 15 minutes without incident. She was provided with Vaccine Information Sheet and instruction to access the V-Safe system.   Ms. Porrata was instructed to call 911 with any severe reactions post vaccine: Marland Kitchen Difficulty breathing  . Swelling of face and throat  . A fast heartbeat  . A bad rash all over body  . Dizziness and weakness

## 2019-08-19 ENCOUNTER — Institutional Professional Consult (permissible substitution): Payer: 59 | Admitting: Plastic Surgery

## 2019-08-19 ENCOUNTER — Encounter: Payer: Self-pay | Admitting: Plastic Surgery

## 2019-08-19 ENCOUNTER — Other Ambulatory Visit: Payer: Self-pay

## 2019-08-19 ENCOUNTER — Ambulatory Visit: Payer: 59 | Admitting: Plastic Surgery

## 2019-08-19 VITALS — BP 136/86 | HR 98 | Temp 99.3°F | Ht 62.0 in | Wt 115.0 lb

## 2019-08-19 DIAGNOSIS — D0512 Intraductal carcinoma in situ of left breast: Secondary | ICD-10-CM | POA: Diagnosis not present

## 2019-08-19 NOTE — Progress Notes (Signed)
Referring Provider Juline Patch, MD [address] [place],  [place] [address]   CC:  Chief Complaint  Patient presents with  . Advice Only    Patient her for breast construction      Danielle Bishop is an 49 y.o. female.  HPI: Patient is here to discuss breast reconstruction.  She noticed nipple discharge some months ago which prompted focused biopsies suggesting DCIS on the left side.  She has since had 2 lumpectomies on the left and the most recent specimen did have positive margins.  Subsequent MRI has suggested another area that is suspicious on the left and a couple areas on the right that were suspicious as well.  She believes one of the specimens from the right showed atypical lobular hyperplasia but I have not seen the pathology report for that.  She also has a genetics test that is pending.  At the moment she is still undecided about whether to proceed with additional lumpectomies on the left or proceed directly to a mastectomy.  It sounds like she will need at least a surgical excision on the right and a positive genetics test may prompt her for more aggressive treatment on that side as well.  She was then familiarized herself with all the various reconstructive options to help guide her decision making.  No Known Allergies  No outpatient encounter medications on file as of 08/19/2019.   No facility-administered encounter medications on file as of 08/19/2019.     Past Medical History:  Diagnosis Date  . Anxiety   . Breast cancer, left [facility])    dx 09/ 2020 by bx with atypical ductal hyperplasia/ papillnoma;   s/p  lefft breast excsional bx 05-21-2019 ,  dx DCIS    Past Surgical History:  Procedure Laterality Date  . RADIOACTIVE SEED GUIDED EXCISIONAL BREAST BIOPSY Left 05/21/2019   Procedure: LEFT RADIOACTIVE SEED GUIDED EXCISIONAL BREAST BIOPSY;  Surgeon: Stark Klein, MD;  Location: [facility];  Service: General;  Laterality: Left;  . RE-EXCISION OF  BREAST LUMPECTOMY Left 06/19/2019   Procedure: RE-EXCISION OF LEFT BREAST LUMPECTOMY;  Surgeon: Stark Klein, MD;  Location: [facility];  Service: General;  Laterality: Left;    Family History  Problem Relation Age of Onset  . Stroke Mother   . Cancer Maternal Grandmother   . Breast cancer Maternal Grandmother 33    Social History   Social History Narrative  . Not on file  Current active smoker  Review of Systems General: Denies fevers, chills, weight loss CV: Denies chest pain, shortness of breath, palpitations  Physical Exam Vitals with BMI 08/19/2019 07/09/2019 06/24/2019  Height 5\' 2"  - -  Weight 115 lbs 116 lbs 6 oz -  BMI 123XX123 123XX123 -  Systolic [id number] -  Diastolic 86 84 -  Pulse 98 71 88    General:  No acute distress,  Alert and oriented, Non-Toxic, Normal speech and affect Breast: She has no ptosis.  She has relatively small breasts bilaterally and they appear fairly symmetric.  On the left side there is an inferior periareolar scar.  There is a small amount of bruising superiorly.  I do not see any obvious scars on the right side.  Her base width of the breast is about 11.5 cm.  The height of her breast footprint is about 9.5 cm.  Assessment/Plan Patient presents to discuss options for breast reconstruction.  I went through the full spectrum of options that would  cover which ever surgical cancer treatment she chose.  If she continues with further lumpectomies we did discuss the fact that there may not be much breast tissue remaining on that side.  Additionally if negative margins were able to be obtained she would likely still require radiation on the left.  If she elects to not undergo mastectomy I described potential reconstructive solutions in the lumpectomy setting that include fat grafting, and placement of breast implants that would need to be done in delayed fashion.  If she chooses to proceed with a mastectomy we discussed both implant-based and tissue  based reconstruction.  She seems to prefer implant-based reconstruction to tissue based.  I discussed the pros and cons of each.  In regards to implant-based reconstruction we discussed the need for placement of a tissue expander which can go in front of underneath the muscle which is subsequently switched to a silicone implant.  In regards to management of the right side I explained that it can be challenging to match the reconstructed breast to a natural breast.  Although in her case a small augmentation on the right might match reconstructed left breast fairly well.  I explained the complexities of matching and radiated side to a nonradiated side as well.  She took notes of our conversation and wants to think about all the information and is going to meet with Dr. Barry Dienes again to discuss surgical treatment strategy.  I offered to see her again if need be to discuss this further after she makes a decision regarding her cancer management.  Danielle Bishop 08/19/2019, 3:19 PM

## 2019-08-22 ENCOUNTER — Inpatient Hospital Stay: Payer: 59 | Attending: Genetic Counselor | Admitting: Genetic Counselor

## 2019-08-22 DIAGNOSIS — Z803 Family history of malignant neoplasm of breast: Secondary | ICD-10-CM | POA: Diagnosis not present

## 2019-08-22 DIAGNOSIS — D0512 Intraductal carcinoma in situ of left breast: Secondary | ICD-10-CM

## 2019-08-22 DIAGNOSIS — Z808 Family history of malignant neoplasm of other organs or systems: Secondary | ICD-10-CM | POA: Diagnosis not present

## 2019-08-23 ENCOUNTER — Encounter: Payer: Self-pay | Admitting: Genetic Counselor

## 2019-08-23 DIAGNOSIS — Z808 Family history of malignant neoplasm of other organs or systems: Secondary | ICD-10-CM | POA: Insufficient documentation

## 2019-08-23 NOTE — Progress Notes (Signed)
REFERRING PROVIDER: Stark Klein, MD [address] [place],  [place] [address]  PRIMARY PROVIDER:  Juline Patch, MD  PRIMARY REASON FOR VISIT:  1. Ductal carcinoma in situ (DCIS) of left breast   2. Family history of breast cancer   3. Family history of skin cancer   4. Family history of cancer of tongue      I connected with Danielle Bishop on 08/23/2019 at 3:00 pm EDT by MyChart video conference and verified that I am speaking with the correct person using two identifiers.   Patient location: Work Provider location: [employer] office  HISTORY OF PRESENT ILLNESS:   Danielle Bishop, a 49 y.o. female, was seen for a [facility] cancer genetics consultation at the request of Dr. Barry Dienes due to a personal and family history of breast cancer.  Danielle Bishop presents to clinic today to discuss the possibility of a hereditary predisposition to cancer, genetic testing, and to further clarify her future cancer risks, as well as potential cancer risks for family members.   In 2020, at the age of 70, Danielle Bishop was diagnosed with DCIS, ER+/PR+, of the left breast. She has undergone two lumpectomies with positive margins. A biopsy of the right breast has also shown atypical lobular hyperplasia. She is in the process of deciding what type of surgery she will have.  She has genetic testing that is pending, coordinated by her oncologist, Dr. Mike Gip.   CANCER HISTORY:  Oncology History   No history exists.     RISK FACTORS:  Menarche was at age 70.  No live births.  OCP use for approximately 7-8 years.  Ovaries intact: yes.  Hysterectomy: no.  Menopausal status: premenopausal.  HRT use: 0 years. Colonoscopy: not yet. Mammogram within the last year: yes. Number of breast biopsies: 4. Any excessive radiation exposure in the past: no  Past Medical History:  Diagnosis Date  . Anxiety   . Breast cancer, left [facility])    dx 09/ 2020 by bx with atypical ductal hyperplasia/ papillnoma;   s/p  lefft  breast excsional bx 05-21-2019 ,  dx DCIS  . Family history of breast cancer   . Family history of cancer of tongue   . Family history of skin cancer     Past Surgical History:  Procedure Laterality Date  . RADIOACTIVE SEED GUIDED EXCISIONAL BREAST BIOPSY Left 05/21/2019   Procedure: LEFT RADIOACTIVE SEED GUIDED EXCISIONAL BREAST BIOPSY;  Surgeon: Stark Klein, MD;  Location: [place];  Service: General;  Laterality: Left;  . RE-EXCISION OF BREAST LUMPECTOMY Left 06/19/2019   Procedure: RE-EXCISION OF LEFT BREAST LUMPECTOMY;  Surgeon: Stark Klein, MD;  Location: [place];  Service: General;  Laterality: Left;    Social History   Socioeconomic History  . Marital status: Married    Spouse name: Not on file  . Number of children: Not on file  . Years of education: Not on file  . Highest education level: Not on file  Occupational History  . Not on file  Tobacco Use  . Smoking status: Current Every Day Smoker    Packs/day: 0.50    Years: 20.00    Pack years: 10.00    Types: Cigarettes  . Smokeless tobacco: Never Used  Substance and Sexual Activity  . Alcohol use: Yes    Comment: occas  . Drug use: No  . Sexual activity: Yes    Birth control/protection: None  Other Topics Concern  . Not on  file  Social History Narrative  . Not on file   Social Determinants of Health   Financial Resource Strain:   . Difficulty of Paying Living Expenses:   Food Insecurity:   . Worried About Charity fundraiser in the Last Year:   . Arboriculturist in the Last Year:   Transportation Needs:   . Film/video editor (Medical):   Marland Kitchen Lack of Transportation (Non-Medical):   Physical Activity:   . Days of Exercise per Week:   . Minutes of Exercise per Session:   Stress:   . Feeling of Stress :   Social Connections:   . Frequency of Communication with Friends and Family:   . Frequency of Social Gatherings with Friends and Family:   . Attends Religious  Services:   . Active Member of Clubs or Organizations:   . Attends Archivist Meetings:   Marland Kitchen Marital Status:      FAMILY HISTORY:  We obtained a detailed, 4-generation family history.  Significant diagnoses are listed below: Family History  Problem Relation Age of Onset  . Stroke Mother   . Breast cancer Maternal Grandmother 46  . Skin cancer Paternal Grandmother   . Skin cancer Paternal Grandfather   . Breast cancer Other   . Tongue cancer Other   . Cancer Other        unknown type  . Breast cancer Other 90  . Breast cancer Other    Danielle Bishop does not have children. She has one brother who is 27 and has not had cancer.   Danielle Bishop mother is 5 and does not have a history of cancer. Danielle Bishop has one maternal aunt and two maternal uncles, all in their 45s and 49s without a history of cancer. Her maternal grandmother died at age 51 from breast cancer that was initially diagnosed on her 50th birthday. Her maternal grandfather died in his late 3s from pneumonia. Danielle Bishop maternal grandmother also had two sisters with breast cancer - one who is currently living at age 52 who also had a history of tongue cancer and another unspecified type of cancer, and another sister who died at age 12 and was diagnosed with breast cancer around age 77. Danielle Bishop's great-great grandmother (maternal grandmother's grandmother) also had breast cancer.  Danielle Bishop father is 32 and does not have a history of cancer. She has one paternal uncle who is 16 and has not had cancer. Her paternal grandmother died in her 53s from a car accident and did have a history of skin cancer, and her paternal grandfather died in his 66s and also had a history of skin cancer.  Danielle Bishop is unaware of previous family history of genetic testing for hereditary cancer risks. Her maternal ancestors are of [place] descent, and paternal ancestors are of [place] and [place] descent. There is no reported Ashkenazi Jewish  ancestry. There is no known consanguinity.  GENETIC COUNSELING ASSESSMENT: Danielle Bishop is a 49 y.o. female with a personal and family history of breast cancer, some diagnosed at young ages, which is somewhat suggestive of a hereditary cancer syndrome and predisposition to cancer. We, therefore, discussed and recommended the following at today's visit.   DISCUSSION: We discussed that 5-10% of breast cancer is hereditary, with most cases associated with the BRCA1 and BRCA2 genes.  There are other genes that can be associated with hereditary breast cancer syndromes.  These include ATM, CHEK2, PALB2, etc.  We discussed that testing  is beneficial for several reasons, including knowing about other cancer risks, identifying potential screening and risk-reduction options that may be appropriate, and to understand if other family members could be at risk for cancer and allow them to undergo genetic testing.   We reviewed the characteristics, features and inheritance patterns of hereditary cancer syndromes. We also discussed genetic testing, including the appropriate family members to test, the process of testing, insurance coverage and turn-around-time for results. We discussed the implications of a negative, positive and/or variant of uncertain significant result. Danielle Bishop stated that she has already had the process of genetic testing started by her oncologist, Dr. Mike Gip.   Based on Danielle Bishop's personal and family history of cancer, she meets medical criteria for genetic testing. Despite that she meets criteria, she may still have an out of pocket cost. We discussed that if her out of pocket cost for testing is over $100, the laboratory will call and confirm whether she wants to proceed with testing.  If the out of pocket cost of testing is less than $100 she will be billed by the genetic testing laboratory.   Lastly, we discussed that some people do not want to undergo genetic testing due to fear of genetic  discrimination.  A federal law called the Genetic Information Non-Discrimination Act (GINA) of 2008 helps protect individuals against genetic discrimination based on their genetic test results.  It impacts both health insurance and employment.  With health insurance, it protects against increased premiums, being kicked off insurance or being forced to take a test in order to be insured.  For employment it protects against hiring, firing and promoting decisions based on genetic test results.  Health status due to a cancer diagnosis is not protected under GINA.  Additionally, life, disability, and long-term care insurance is not protected under GINA.   PLAN: Genetic testing is already in progress for Danielle Bishop. Once complete, we are happy to review her results with her and evaluate whether there may be any additional testing she wishes to pursue at that time.  Danielle Bishop questions were answered to her satisfaction today. Our contact information was provided should additional questions or concerns arise. Thank you for the referral and allowing [place] to share in the care of your patient.   Clint Guy, MS, [facility] Genetic Counselor [email or web address] Phone: [phone]  The patient was seen for a total of 40 minutes in face-to-face genetic counseling.  This patient was discussed with Drs. Magrinat, Lindi Adie and/or Burr Medico who agrees with the above.    _______________________________________________________________________ For Office Staff:  Number of people involved in session: 1 Was an Intern/ student involved with case: no

## 2019-09-03 ENCOUNTER — Encounter: Payer: Self-pay | Admitting: Hematology and Oncology

## 2019-09-06 ENCOUNTER — Telehealth: Payer: Self-pay | Admitting: Genetic Counselor

## 2019-09-06 NOTE — Telephone Encounter (Signed)
Called to discuss her genetic test results and answer any questions she may have. Left my contact information and requested that she call back.

## 2019-09-18 ENCOUNTER — Ambulatory Visit: Payer: 59 | Attending: Internal Medicine

## 2019-09-18 DIAGNOSIS — Z23 Encounter for immunization: Secondary | ICD-10-CM

## 2019-09-18 NOTE — Progress Notes (Signed)
   Covid-19 Vaccination Clinic  Name:  Danielle Bishop    MRN: [id number] DOB: September 24, 1970  09/18/2019  Danielle Bishop was observed post Covid-19 immunization for 15 minutes without incident. She was provided with Vaccine Information Sheet and instruction to access the V-Safe system.   Danielle Bishop was instructed to call 911 with any severe reactions post vaccine: Marland Kitchen Difficulty breathing  . Swelling of face and throat  . A fast heartbeat  . A bad rash all over body  . Dizziness and weakness   Immunizations Administered    Name Date Dose VIS Date Route   Moderna COVID-19 Vaccine 09/18/2019  8:54 AM 0.5 mL 05/14/2019 Intramuscular   Manufacturer: Levan Hurst   LotUD:[id number]   [place][id number]

## 2019-10-03 ENCOUNTER — Encounter: Payer: Self-pay | Admitting: *Deleted

## 2019-10-03 NOTE — Progress Notes (Signed)
Called patient today to follow up on where she is at with her care.  Patient is upset and feels anxious.  She wants to talk to Dr. Mike Gip.  States she saw Dr. Alonna Minium last week and is trying to decide on a surgical plan.  Sent message to Dr. Mike Gip and team to schedule a mychart video with the patient.  Requested that she call me back if she has not heard from anyone.

## 2019-10-04 ENCOUNTER — Telehealth: Payer: 59 | Admitting: Hematology and Oncology

## 2019-10-07 ENCOUNTER — Inpatient Hospital Stay: Payer: 59 | Admitting: Hematology and Oncology

## 2019-10-07 ENCOUNTER — Encounter: Payer: Self-pay | Admitting: Hematology and Oncology

## 2019-10-07 NOTE — Progress Notes (Unsigned)
Rn called pt, name and DOB verified.  Pt is not on any medications at this time.  Denies any concerns today.

## 2019-10-07 NOTE — Progress Notes (Signed)
[facility]  [address] [place], [place] [address] Phone: [phone]  Fax: [phone]   Telemedicine Office Visit:  10/08/2019  Referring physician: Juline Patch, MD  I connected with Danielle Bishop on 10/08/2019 at 8:47 AM by videoconferencing and verified that I was speaking with the correct person using 2 identifiers.  The patient was at home.  I discussed the limitations, risk, security and privacy concerns of performing an evaluation and management service by videoconferencing and the availability of in person appointments.  I also discussed with the patient that there may be a patient responsible charge related to this service.  The patient expressed understanding and agreed to proceed.   Chief Complaint: Danielle Bishop is a 49 y.o. female withleft breast DCIS who is seen for a 2 month assessment to discuss patient's thoughts and concerns about surgery.  HPI: The patient was last seen in the medical oncology clinic on 08/02/2019 via telemedicine. At that time, she was doing well s/p multiple biopsies.     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  She was seen by Dr. Claudia Desanctis in plastic surgery on 08/19/2019. She was considering breast reconstruction. She seemed to prefer implant-based reconstruction to tissue based. Dr. Claudia Desanctis offered to see her again if need be to discuss this further after she made decisions regarding her cancer management.  During the interim, the patient was doing well. Patient is considering surgery and different options. She would like more advice to make a final decision. Patient had questions regarding radiation therapy. I informed patient if she has a mastectomy with DCIS (based on final pathology) there will be no radiation therapy.  Endocrine therapy would be suggested. I discussed the benefits of  endocrine therapy as well as surveillance. Patient notes after being informed about all of her surgical and treatment options she feels like she can make a good decision now.   She reports nipple discharge s/p surgery. She has no other breast concerns.    Past Medical History:  Diagnosis Date  . Anxiety   . Breast cancer, left [facility])    dx 09/ 2020 by bx with atypical ductal hyperplasia/ papillnoma;   s/p  lefft breast excsional bx 05-21-2019 ,  dx DCIS  . Family history of breast cancer   . Family history of cancer of tongue   . Family history of skin cancer     Past Surgical History:  Procedure Laterality Date  . RADIOACTIVE SEED GUIDED EXCISIONAL BREAST BIOPSY Left 05/21/2019   Procedure: LEFT RADIOACTIVE SEED GUIDED EXCISIONAL BREAST BIOPSY;  Surgeon: Stark Klein, MD;  Location: [place];  Service: General;  Laterality: Left;  . RE-EXCISION OF BREAST LUMPECTOMY Left 06/19/2019   Procedure: RE-EXCISION OF LEFT BREAST LUMPECTOMY;  Surgeon: Stark Klein, MD;  Location: [place];  Service: General;  Laterality: Left;    Family History  Problem Relation Age of Onset  . Stroke Mother   . Breast cancer Maternal Grandmother 27  . Skin cancer Paternal Grandmother   . Skin cancer Paternal Grandfather   . Breast cancer Other   . Tongue cancer Other   . Cancer Other        unknown type  . Breast cancer Other 90  . Breast cancer Other     Social History:  reports that she has been smoking cigarettes. She has a 10.00 pack-year  smoking history. She has never used smokeless tobacco. She reports current alcohol use. She reports that she does not use drugs. She smoked 1/2 pack/day x 20 years. She occasionally drinks alcohol. Sge denies any exposure to radiation or toxins. 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 The patient is alone today.  Participants in the patient's visit and their role in the encounter included the patient,  and Vito Berger, CMA, today.  The intake visit was provided by Vito Berger, CMA.  Allergies: No Known Allergies  Current Medications: No current outpatient medications on file.   No current facility-administered medications for this visit.     Review of Systems  Constitutional: Negative.  Negative for chills, diaphoresis, fever, malaise/fatigue and weight loss (no new weight).       Doing well.  HENT: Negative.  Negative for congestion, hearing loss, nosebleeds, sinus pain and sore throat.   Eyes: Negative.  Negative for blurred vision and double vision.  Respiratory: Negative.  Negative for cough, sputum production and shortness of breath.   Cardiovascular: Negative for chest pain, palpitations and leg swelling.       Nipple discharge.  Gastrointestinal: Negative.  Negative for abdominal pain, blood in stool, constipation, diarrhea, heartburn, melena, nausea and vomiting.  Genitourinary: Negative.  Negative for dysuria, frequency, hematuria and urgency.  Musculoskeletal: Negative.  Negative for back pain, joint pain, myalgias and neck pain.  Skin: Negative.  Negative for itching and rash.  Neurological: Negative.  Negative for dizziness, tingling, sensory change, weakness and headaches.  Endo/Heme/Allergies: Does not bruise/bleed easily.  Psychiatric/Behavioral: Negative.  Negative for depression and memory loss. The patient is not nervous/anxious and does not have insomnia.   All other systems reviewed and are negative.   Performance status (ECOG): 0-1  Physical Exam  Constitutional: She is oriented to person, place, and time. She appears well-developed and well-nourished. No distress.  HENT:  Head: Normocephalic and atraumatic.  Long dark hair.  Eyes: Conjunctivae and EOM are normal. No scleral icterus.  Neurological: She is alert and oriented to person, place, and time.  Skin: She is not diaphoretic.  Psychiatric: She has a normal mood and affect. Her behavior is  normal. Judgment and thought content normal.  Nursing note reviewed.   No visits with results within 3 Day(s) from this visit.  Latest known visit with results is:  Admission on 06/19/2019, Discharged on 06/19/2019  Component Date Value Ref Range Status  . Preg Test, Ur 06/19/2019 NEGATIVE  NEGATIVE Final   Comment:        THE SENSITIVITY OF THIS METHODOLOGY IS >24 mIU/mL   . SURGICAL PATHOLOGY 06/19/2019    Final-Edited                   Value:SURGICAL PATHOLOGY CASE: [id number] PATIENT: Danielle Bishop Surgical Pathology Report  Clinical History: Left breast cancer (cm)  FINAL MICROSCOPIC DIAGNOSIS:  A. BREAST, LEFT POSTERIOR MARGIN, RE-EXCISION: - Focus of residual ductal carcinoma in situ. - Atypical lobular hyperplasia/adenosis. - Please see comment.  B. BREAST, LEFT INFERIOR MARGIN, RE-EXCISION: - Residual ductal carcinoma in situ. - Please see comment.  COMMENT:  A. The focus of residual DCIS is present at the cauterized black ink. CK5/6 is negative in DCIS.              E-cadherin demonstrates weak staining in atypical lobular hyperplasia.  P63, Calponin and SMM-1 demonstrate the presence of myoepithelium throughout.  B.  Focally, residual DCIS is  present at the cauterized black ink. CK5/6 is negative in DCIS.  Intradepartmental consultation (Dr. Tresa Moore).  GROSS DESCRIPTION:  A. Received fresh and placed in formalin at 12 PM is a 3 x 2.6 x 1.5 cm portion of tan-yellow soft tiss                         ue, received entirely inked black. Sectioned and entirely submitted in 5 cassettes.  B.  Received fresh and placed in formalin at 12 PM is a 3.1 x 1.5 x 1 cm portion of tan-yellow soft tissue, received entirely inked blue. Sectioned and entirely submitted in 5 cassettes.  (AK 06/19/2019)  Final Diagnosis performed by Gillie Manners, MD.   Electronically signed 06/24/2019 Technical and / or Professional components performed at [facility]. [facility],  [place] [address] [place], [place] [address].  Immunohistochemistry Technical component (if applicable) was performed at [facility]. [address], [place], [place],  [address].   IMMUNOHISTOCHEMISTRY DISCLAIMER (if applicable): Some of these immunohistochemical stains may have been developed and the performance characteristics determine by [facility]. Some may not have been cleared or approved by the U.S. Food and Drug Administration. The FDA has determined that such clear                         ance or approval is not necessary. This test is used for clinical purposes. It should not be regarded as investigational or for research. This laboratory is certified under the [place] (CLIA-88) as qualified to perform high complexity clinical laboratory testing.  The controls stained appropriately.    Assessment:  Danielle Bishop is a 49 y.o. female with left breast DCISs/p excisional biopsy on 05/21/2019. Pathologyrevealed 2.8 cm grade II ductal carcinoma in situ (DCIS). The posterior/inferior margin was focally positive. DCIS was present <1 mm from the superior, medial and lateral margins. Stage was pTispNX. DCIS was ER+ (95%) and PR+ (100%).   Re-excisionon 06/19/2019 revealed afocus of residual DCISandatypical lobular hyperplasia/adenosisat the left posterior margin.There was residual DCISatthe left inferior margin. The fociof residual DCIS from the posterior and inferior margin werepresent at the cauterized black ink on the left posterior and inferiormargins.  Bilateral mammogram with left axillary ultrasoundon 02/14/2019 revealed duct ectasia in the 6-7 o'clock location of the LEFT breast without identifiable intraductal mass. There was a solid 0.7 x 0.5 x 0.7 cm mass in the 7 o'clock location 2 cm from the nipple in the LEFT breast. There was an enlarged LEFT axillary lymph node. There was a  single duct spontaneous LEFT nipple discharge.  Bilateral breast MRI on 07/16/2019 revealed postoperative seroma in the central inferior left breast at the site of patient's lumpectomy for DCIS. Non-mass enhancement was seen surrounding the lumpectomy site involving the entire inferior left breast, spanning 4.0 x 5.8 x 4.3 cm. There was suspicious linear enhancement extending from the postoperative seroma into the nipple. There was indeterminate patchy non-mass enhancement extending into the upper inner quadrant (UIQ) of the left breast (1.3 x 1.2 cm). There were two indeterminate enhancing masses in the upper inner and lower outer right breast. There was no suspicious lymphadenopathy.  Left breast biopsy on 07/26/2019 revealed fibrocystic changes in the upper inner quadrant (UIQ) and intermediate grade DCIS in the lower outer quadrant (LOQ). DCIS was ER+ (95%) and PR+ (100%).  Right breast biopsy on 07/29/2019 revealed atypical lobular  hyperplasia, findings suggestive of lipoma, and fibrocystic changes in lower outer quadrant (LOQ).   She has a family historyof breast cancer (maternal grandmother age 68).  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  Symptomatically, she is doing well.  She is considering all of her options.  Plan: 1. Left breast DCIS Initial pathology revealedgrade II DCIS with focally positive margin. Re-excisionpathology revealed persistent + margins (posterior and inferior). Results of left and right MRI guided breast biopsies revealed:             Intermediate grade DCIS in the left lower outer quadrant.                         Atypical lobular hyperplasia in the right breast.                         The lesion in the upper inner quadrant of the right breast was located too posteriorly to  biopsy (a clip was placed).             We discussed multiple surgical options today.  We discussed no plan for radiation if final pathology reveals only DCIS and she has undergone a mastectomy.  We discussed endocrine therapy.  Multiple questions were asked and answered. 2. Family history of breast cancer Patient 58 with family history of breast cancer in a maternal grandmother at age 66. [facility] genetic testing was reviewed.   She has a variant of unknown significance. 3.    RTC in early 11/2019 for MD assessment.  I discussed the assessment and treatment plan with the patient.  The patient was provided an opportunity to ask questions and all were answered.  The patient agreed with the plan and demonstrated an understanding of the instructions.  The patient was advised to call back or seek an in person evaluation if the symptoms worsen or if the condition fails to improve as anticipated.  I provided 18 minutes (8:47 AM - 9:05 AM) of face-to-face video visit time during this this encounter and > 50% was spent counseling as documented under my assessment and plan.  An additional 5 minutes were spent reviewing his chart (Epic and Care Everywhere) including notes, labs, and imaging studies.  I provided these services from the [facility] office.   Nolon Stalls, MD, PhD  10/08/2019, 8:47 AM  I, Selena Batten, am acting as scribe for [place]. Mike Gip, MD, PhD.  I, Charnetta Wulff C. Mike Gip, MD, have reviewed the above documentation for accuracy and completeness, and I agree with the above.

## 2019-10-08 ENCOUNTER — Encounter: Payer: Self-pay | Admitting: Hematology and Oncology

## 2019-10-08 ENCOUNTER — Inpatient Hospital Stay: Payer: 59 | Attending: Hematology and Oncology | Admitting: Hematology and Oncology

## 2019-10-08 DIAGNOSIS — N6091 Unspecified benign mammary dysplasia of right breast: Secondary | ICD-10-CM

## 2019-10-08 DIAGNOSIS — D0512 Intraductal carcinoma in situ of left breast: Secondary | ICD-10-CM | POA: Diagnosis not present

## 2019-10-08 DIAGNOSIS — Z803 Family history of malignant neoplasm of breast: Secondary | ICD-10-CM | POA: Diagnosis not present

## 2019-10-08 NOTE — Progress Notes (Signed)
The patient name and DOB was verified by phone on 10/07/2019.

## 2019-10-09 ENCOUNTER — Telehealth: Payer: 59 | Admitting: Hematology and Oncology

## 2019-10-15 ENCOUNTER — Telehealth: Payer: Self-pay | Admitting: Plastic Surgery

## 2019-10-15 NOTE — Telephone Encounter (Signed)
Multiple attempts to contact patient have been made, including messages left on patients voicemail, MyChart message, and secure email. [facility] has also attempted to contact patient with no success. If patient calls, please advise patient that a follow up appointment with Dr. Claudia Desanctis is needed ASAP. If possible, please send call directly to me at [phone] or ask the patient for a convenient time to have me return a call.

## 2019-10-18 ENCOUNTER — Ambulatory Visit: Payer: 59 | Admitting: Family Medicine

## 2019-10-18 ENCOUNTER — Encounter: Payer: Self-pay | Admitting: Family Medicine

## 2019-10-18 ENCOUNTER — Other Ambulatory Visit: Payer: Self-pay

## 2019-10-18 VITALS — BP 110/60 | HR 88 | Ht 62.0 in | Wt 115.0 lb

## 2019-10-18 DIAGNOSIS — F410 Panic disorder [episodic paroxysmal anxiety] without agoraphobia: Secondary | ICD-10-CM | POA: Diagnosis not present

## 2019-10-18 DIAGNOSIS — F419 Anxiety disorder, unspecified: Secondary | ICD-10-CM

## 2019-10-18 DIAGNOSIS — F329 Major depressive disorder, single episode, unspecified: Secondary | ICD-10-CM | POA: Diagnosis not present

## 2019-10-18 MED ORDER — ALPRAZOLAM 0.25 MG PO TABS: 0.2500 mg | ORAL_TABLET | Freq: Two times a day (BID) | ORAL | 1 refills | Status: DC | PRN

## 2019-10-18 MED ORDER — SERTRALINE HCL 50 MG PO TABS: 50.0000 mg | ORAL_TABLET | Freq: Every day | ORAL | 3 refills | Status: DC

## 2019-10-18 NOTE — Progress Notes (Signed)
Date:  10/18/2019   Name:  Danielle Bishop   DOB:  [birth year]   MRN:  [id number]   Chief Complaint: Anxiety (situational anxiety s/p oncology)  Anxiety Presents for follow-up visit. Symptoms include nervous/anxious behavior and panic. Patient reports no chest pain, confusion, excessive worry, obsessions, palpitations, restlessness or shortness of breath. Symptoms occur occasionally.   Her past medical history is significant for depression.  Depression      The patient presents with depression.  This is a new problem.  The current episode started more than 1 month ago.   The onset quality is gradual.   The problem occurs intermittently.  Associated symptoms include fatigue, irritable and sad.  Associated symptoms include no restlessness.     The symptoms are aggravated by work stress (medical concern).  Past treatments include nothing.  Past medical history includes anxiety and depression.     No results found for: CREATININE, BUN, NA, K, CL, CO2 No results found for: CHOL, HDL, LDLCALC, LDLDIRECT, TRIG, CHOLHDL No results found for: TSH No results found for: HGBA1C No results found for: WBC, HGB, HCT, MCV, PLT No results found for: ALT, AST, GGT, ALKPHOS, BILITOT   Review of Systems  Constitutional: Positive for fatigue. Negative for unexpected weight change.  HENT: Negative for congestion.   Respiratory: Negative for choking and shortness of breath.   Cardiovascular: Negative for chest pain and palpitations.  Psychiatric/Behavioral: Positive for depression. Negative for agitation and confusion. The patient is nervous/anxious. The patient is not hyperactive.     Patient Active Problem List   Diagnosis Date Noted  . Family history of skin cancer   . Family history of cancer of tongue   . 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 Family history of breast cancer 07/09/2019  . 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 RADIOACTIVE SEED GUIDED EXCISIONAL BREAST BIOPSY Left 05/21/2019   Procedure: LEFT RADIOACTIVE SEED GUIDED EXCISIONAL BREAST BIOPSY;  Surgeon: Stark Klein, MD;  Location: [place];  Service: General;  Laterality: Left;  . RE-EXCISION OF BREAST LUMPECTOMY Left 06/19/2019   Procedure: RE-EXCISION OF LEFT BREAST LUMPECTOMY;  Surgeon: Stark Klein, MD;  Location: [place];  Service: General;  Laterality: Left;    Social History   Tobacco Use  . Smoking status: Current Every Day Smoker    Packs/day: 0.50    Years: 20.00    Pack years: 10.00    Types: Cigarettes  . Smokeless tobacco: Never Used  Substance Use Topics  . Alcohol use: Yes    Comment: occas  . Drug use: No     Medication list has been reviewed and updated.  No outpatient medications have been marked as taking for the 10/18/19 encounter (Office Visit) with Juline Patch, MD.    [facility] 2/9 Scores 10/18/2019 04/17/2017  PHQ - 2 Score 3 0  PHQ- 9 Score 7 1    BP Readings from Last 3 Encounters:  10/18/19 110/60  08/19/19 136/86  07/09/19 116/84    Physical Exam Vitals and nursing note reviewed.  Constitutional:      General: She is irritable. She is not in acute distress.    Appearance: She is not diaphoretic.  HENT:     Head: Normocephalic and atraumatic.     Right Ear: External ear normal.     Left Ear: External ear  normal.     Nose: Nose normal.  Eyes:     General:        Right eye: No discharge.        Left eye: No discharge.     Conjunctiva/sclera: Conjunctivae normal.     Pupils: Pupils are equal, round, and reactive to light.  Neck:     Thyroid: No thyromegaly.     Vascular: No JVD.  Cardiovascular:     Rate and Rhythm: Normal rate and regular rhythm.     Heart sounds: Normal heart sounds. No murmur. No friction rub. No gallop.   Pulmonary:     Effort: Pulmonary effort is normal.     Breath sounds:  Normal breath sounds.  Abdominal:     General: Bowel sounds are normal.     Palpations: Abdomen is soft. There is no mass.     Tenderness: There is no abdominal tenderness. There is no guarding.  Musculoskeletal:        General: Normal range of motion.     Cervical back: Normal range of motion and neck supple.  Lymphadenopathy:     Cervical: No cervical adenopathy.  Skin:    General: Skin is warm and dry.  Neurological:     Mental Status: She is alert.     Deep Tendon Reflexes: Reflexes are normal and symmetric.     Wt Readings from Last 3 Encounters:  10/18/19 115 lb (52.2 kg)  08/19/19 115 lb (52.2 kg)  07/09/19 116 lb 6.5 oz (52.8 kg)    BP 110/60   Pulse 88   Ht 5\' 2"  (1.575 m)   Wt 115 lb (52.2 kg)   LMP 10/03/2019 (Approximate)   BMI 21.03 kg/m   Assessment and Plan:  1. Reactive depression New onset.  Persistent.  Uncontrolled.  PHQ score 7.  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  We will initiate sertraline starting at 25 mg once a day for 2 weeks and then progressing to 50 mg a day. - sertraline (ZOLOFT) 50 MG tablet; Take 1 tablet (50 mg total) by mouth daily.  Dispense: 30 tablet; Refill: 3  2. Anxiety New onset.  Persistent.  Uncontrolled.  Patient's had increasing anxiety given the feeling of helplessness hopelessness and irritability and adjustment to her diagnosis of intraductal carcinoma of the left breast.  There is some anxiety concerning the reconstructive surgery and we have discussed this today and patient is contemplating discussing with Dr. Mike Gip a possible second opinion on her option on surgery. - sertraline (ZOLOFT) 50 MG tablet; Take 1 tablet (50 mg total) by mouth daily.  Dispense: 30 tablet; Refill: 3  3. Panic attack New onset uncontrolled.  Patient works in a high stress job in [employer] and has had increasing stress and workload as well as this underlying medical issue.  Patient has been given some short acting alprazolam 0.25 one half to want to use for panic disorder. - ALPRAZolam (XANAX) 0.25 MG tablet; Take 1 tablet (0.25 mg total) by mouth 2 (two) times daily as needed for anxiety.  Dispense: 20 tablet; Refill: 1  We had an extended discussion about patient's underlying cancer diagnosis and upcoming reconstructive surgery.  We have discussed the possibility of second opinion given the surgical aspect and she feels very comfortable with Dr. Mike Gip as her oncologist.  I will discuss with  Dr. Mike Gip her concern and that she may be approaching her if she has a suggestion if she wanted to have a second opinion on options of reconstruction.

## 2019-10-21 ENCOUNTER — Telehealth: Payer: Self-pay

## 2019-10-21 ENCOUNTER — Other Ambulatory Visit: Payer: Self-pay | Admitting: Hematology and Oncology

## 2019-10-21 DIAGNOSIS — D0512 Intraductal carcinoma in situ of left breast: Secondary | ICD-10-CM

## 2019-10-21 NOTE — Telephone Encounter (Signed)
Faxed new patient referral 2nd opinion to Dr Marla Roe office [phone]. fax conformation received.

## 2019-11-04 ENCOUNTER — Ambulatory Visit: Payer: 59 | Admitting: Plastic Surgery

## 2019-11-06 ENCOUNTER — Ambulatory Visit (INDEPENDENT_AMBULATORY_CARE_PROVIDER_SITE_OTHER): Payer: 59 | Admitting: Plastic Surgery

## 2019-11-06 ENCOUNTER — Encounter: Payer: Self-pay | Admitting: Plastic Surgery

## 2019-11-06 ENCOUNTER — Other Ambulatory Visit: Payer: Self-pay

## 2019-11-06 VITALS — BP 121/82 | HR 77 | Temp 98.2°F | Ht 63.0 in | Wt 111.6 lb

## 2019-11-06 DIAGNOSIS — D0512 Intraductal carcinoma in situ of left breast: Secondary | ICD-10-CM

## 2019-11-06 NOTE — Progress Notes (Signed)
Referring Provider Juline Patch, MD [address] [place],  [place] [address]   CC:  Chief Complaint  Patient presents with   Follow-up    breast reconstruction      Danielle Bishop is an 49 y.o. female.  HPI: Patient returns to discuss her options for breast reconstruction.  She is been going back and forth regarding her plan for a mastectomy on the left side only versus bilateral mastectomy.  She has 2 remaining lesions on the right side that need to be excised and is looking for a definitive solution.  She has had 2 previous lumpectomies on the left and remains with a positive margin.  She is leaning towards bilateral mastectomies.  She is still smoking.  Review of Systems General: Denies fevers, chills  Physical Exam Vitals with BMI 11/06/2019 10/18/2019 08/19/2019  Height 5\' 3"  5\' 2"  5\' 2"   Weight 111 lbs 10 oz 115 lbs 115 lbs  BMI 19.77 123XX123 [id number]  Systolic 123XX123 [id number]  Diastolic 82 60 86  Pulse 77 88 98    General:  No acute distress,  Alert and oriented, Non-Toxic, Normal speech and affect Breast: Unchanged from last visit.  She has a inferior periareolar scar on the left side from her lumpectomies.  She probably has a cup breast with good skin integrity and no ptosis  Assessment/Plan Patient presents to discuss her options for breast reconstruction.  She seems to be leaning towards bilateral mastectomies and I think this would be favorable from the standpoint of symmetry.  I reconstructed breast on the left side would never match well with her current breast size on the right side particularly after 2 lumpectomies were performed.  We did discuss the potential of direct implant reconstruction.  I do think she would be a good candidate for a nipple sparing mastectomy.  We could do this through a periareolar extending to a radial scar given her current scarring on the left side.  I explained the pros and cons of direct implant versus tissue expander based reconstruction.  I  explained the differences and placement of implants below the muscle versus in front of the muscle.  I explained that smoking would dramatically increase her chance of complications with either reconstructive choice.  If she is able to stop smoking and we evaluate the mastectomy flaps intraoperatively and there found to be well perfused and would be reasonable to proceed to direct implant reconstruction in a prepectoral plane.  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  If she is unable to stop smoking I'd favor placing tissue expanders the day of surgery to minimize tension on mastectomy flaps.  I went over with her multiple times the unfavorable wound healing effects of cigarette smoking.  We discussed other risks of surgery that include bleeding, infection, damage to surrounding structures, need for additional procedures.  I discussed the potential downside of prepectoral placement of the implant including more visibility in the superior pole.  She seems willing to accept that trade off and favors prepectoral placement.  I explained that I would use acellular dermal matrix which would blunt the transition between the chest wall and the implant a bit.  We will plan to test her a week or 2 before surgery for cigarette use.  All her questions were answered and we have a surgery date scheduled coming up.  Greater than 40  minutes were spent discussing the options and coordinating her care.  [name] 11/06/2019, 5:02 PM

## 2019-11-14 ENCOUNTER — Other Ambulatory Visit: Payer: Self-pay | Admitting: General Surgery

## 2019-11-14 DIAGNOSIS — C50812 Malignant neoplasm of overlapping sites of left female breast: Secondary | ICD-10-CM

## 2019-11-29 NOTE — Progress Notes (Signed)
[facility]  [address] [place], [place] [address] Phone: [phone]  Fax: [phone]   Clinic Day:  12/02/2019  Referring physician: Juline Patch, MD  Chief Complaint: Rinda Rollyson is a 49 y.o. 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  HPI: The patient was last seen in the medical oncology clinic on 10/08/2019 for a telemedicine visit. At that time, she was doing well.  She was considering different surgical options.  She saw Dr. Otilio Miu on 10/18/2019. She felt fatigued. She noted episodes of depression x 1 month. She reported some anxiety. Patient was started on Zoloft and Xanax.   She had a follow up with Dr. Claudia Desanctis on 11/06/2019. She was leaning towards bilateral mastectomies. She was still smoking.  Dr. Claudia Desanctis noted smoking would dramatically increase her chance of complications with either reconstructive choice.  Smoking cessation was encouraged.   During the interim, the patient was doing well. Patient has no concerns. She examines her breast every month. Patient has agreed to a bilateral mastectomy on 01/01/2020 at the [facility] in [place], [place]. She is seeing Dr. Claudia Desanctis every Wednesday for an expander. She had quit smoking but relapsed. She is cutting back on smoking and is smoking 2 cigarettes per day and some days she does not smoke at all. Smoking cessation was encouraged. She will no longer be working 7 day a week and her job got rid of third shift. She feels that work will be easier now. Patient received the Moderna COVID-19 vaccine on 08/16/2019 and 09/18/2019.    Past Medical History:  Diagnosis Date  . Anxiety   . Breast cancer, left [facility])    dx 09/ 2020 by bx with atypical ductal hyperplasia/ papillnoma;   s/p  lefft breast excsional bx 05-21-2019 ,  dx DCIS  . Family history of breast cancer   . Family history of cancer of tongue   . Family history of skin cancer     Past Surgical History:  Procedure  Laterality Date  . RADIOACTIVE SEED GUIDED EXCISIONAL BREAST BIOPSY Left 05/21/2019   Procedure: LEFT RADIOACTIVE SEED GUIDED EXCISIONAL BREAST BIOPSY;  Surgeon: Stark Klein, MD;  Location: [place];  Service: General;  Laterality: Left;  . RE-EXCISION OF BREAST LUMPECTOMY Left 06/19/2019   Procedure: RE-EXCISION OF LEFT BREAST LUMPECTOMY;  Surgeon: Stark Klein, MD;  Location: [place];  Service: General;  Laterality: Left;    Family History  Problem Relation Age of Onset  . Stroke Mother   . Breast cancer Maternal Grandmother 38  . Skin cancer Paternal Grandmother   . Skin cancer Paternal Grandfather   . Breast cancer Other   . Tongue cancer Other   . Cancer Other        unknown type  . Breast cancer Other 89  . Breast cancer Other     Social History:  reports that she has been smoking cigarettes. She has a 10.00 pack-year smoking history. She has never used smokeless tobacco. She reports current alcohol use. She reports that she does not use drugs. She smoked 1/2 pack/day x 20 years. She is now smoking 2 cigarettes per day and some days she does not smoke at all.She occasionally drinks alcohol. Sge denies any exposure to radiation or toxins. 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 The patient is alone today.  Allergies: No Known Allergies  Current Medications: Current Outpatient Medications  Medication Sig Dispense Refill  . ALPRAZolam (XANAX) 0.25 MG tablet Take 1 tablet (0.25 mg total) by mouth 2 (two) times daily as needed for anxiety. 20 tablet 1  . sertraline (ZOLOFT) 50 MG tablet Take 1 tablet (50 mg total) by mouth daily. 30 tablet 3   No current facility-administered medications for this visit.    Review of Systems  Constitutional: Positive for weight loss (3 lbs). Negative for chills, diaphoresis, fever and malaise/fatigue.       Doing well. No concerns.  HENT: Negative for congestion, ear discharge,  ear pain, hearing loss, nosebleeds, sinus pain, sore throat and tinnitus.   Eyes: Negative for blurred vision.  Respiratory: Negative for cough, hemoptysis, sputum production and shortness of breath.   Cardiovascular: Negative for chest pain, palpitations and leg swelling.  Gastrointestinal: Negative for abdominal pain, blood in stool, constipation, diarrhea, heartburn, melena, nausea and vomiting.  Genitourinary: Negative for dysuria, flank pain, frequency, hematuria and urgency.  Musculoskeletal: Negative for back pain, joint pain, myalgias and neck pain.  Skin: Negative for itching and rash.  Neurological: Negative for dizziness, tingling, tremors, sensory change, weakness and headaches.  Endo/Heme/Allergies: Does not bruise/bleed easily.  Psychiatric/Behavioral: Negative for depression and memory loss. The patient is not nervous/anxious and does not have insomnia.   All other systems reviewed and are negative.  Performance status (ECOG): 0  Vitals Blood pressure 114/73, pulse 85, temperature (!) 96.7 F (35.9 C), temperature source Tympanic, resp. rate 16, weight 112 lb 14 oz (51.2 kg), SpO2 97 %.   Physical Exam Vitals and nursing note reviewed.  Constitutional:      General: She is not in acute distress.    Appearance: Normal appearance.     Interventions: Face mask in place.  HENT:     Head: Normocephalic and atraumatic.     Comments: Long dark hair.    Mouth/Throat:     Mouth: Mucous membranes are moist.     Pharynx: Oropharynx is clear.  Eyes:     General: No scleral icterus.    Extraocular Movements: Extraocular movements intact.     Conjunctiva/sclera: Conjunctivae normal.     Pupils: Pupils are equal, round, and reactive to light.  Cardiovascular:     Rate and Rhythm: Normal rate and regular rhythm.     Pulses: Normal pulses.     Heart sounds: Normal heart sounds. No murmur heard.   Pulmonary:     Effort: Pulmonary effort is normal. No respiratory distress.      Breath sounds: Normal breath sounds. No wheezing or rales.  Chest:     Chest wall: No tenderness.  Abdominal:     General: Bowel sounds are normal. There is no distension.     Palpations: Abdomen is soft. There is no mass.     Tenderness: There is no abdominal tenderness. There is no guarding.  Musculoskeletal:        General: No swelling or tenderness. Normal range of motion.     Cervical back: Normal range of motion and neck supple.  Lymphadenopathy:     Head:     Right side of head: No preauricular, posterior auricular or occipital adenopathy.     Left side of head: No preauricular, posterior auricular or occipital adenopathy.     Cervical: No cervical adenopathy.     Upper Body:     Right upper body: No supraclavicular or axillary adenopathy.     Left upper body: No supraclavicular or axillary adenopathy.  Lower Body: No right inguinal adenopathy. No left inguinal adenopathy.  Skin:    General: Skin is warm and dry.  Neurological:     Mental Status: She is alert and oriented to person, place, and time. Mental status is at baseline.  Psychiatric:        Mood and Affect: Mood normal.        Behavior: Behavior normal.        Thought Content: Thought content normal.        Judgment: Judgment normal.    No visits with results within 3 Day(s) from this visit.  Latest known visit with results is:  Admission on 06/19/2019, Discharged on 06/19/2019  Component Date Value Ref Range Status  . Preg Test, Ur 06/19/2019 NEGATIVE  NEGATIVE Final   Comment:        THE SENSITIVITY OF THIS METHODOLOGY IS >24 mIU/mL   . SURGICAL PATHOLOGY 06/19/2019    Final-Edited                   Value:SURGICAL PATHOLOGY CASE: [id number] PATIENT: Antonieta Ramnauth Surgical Pathology Report  Clinical History: Left breast cancer (cm)  FINAL MICROSCOPIC DIAGNOSIS:  A. BREAST, LEFT POSTERIOR MARGIN, RE-EXCISION: - Focus of residual ductal carcinoma in situ. - Atypical lobular hyperplasia/adenosis. -  Please see comment.  B. BREAST, LEFT INFERIOR MARGIN, RE-EXCISION: - Residual ductal carcinoma in situ. - Please see comment.  COMMENT:  A. The focus of residual DCIS is present at the cauterized black ink. CK5/6 is negative in DCIS.              E-cadherin demonstrates weak staining in atypical lobular hyperplasia.  P63, Calponin and SMM-1 demonstrate the presence of myoepithelium throughout.  B.  Focally, residual DCIS is present at the cauterized black ink. CK5/6 is negative in DCIS.  Intradepartmental consultation (Dr. Tresa Moore).  GROSS DESCRIPTION:  A. Received fresh and placed in formalin at 12 PM is a 3 x 2.6 x 1.5 cm portion of tan-yellow soft tiss                         ue, received entirely inked black. Sectioned and entirely submitted in 5 cassettes.  B.  Received fresh and placed in formalin at 12 PM is a 3.1 x 1.5 x 1 cm portion of tan-yellow soft tissue, received entirely inked blue. Sectioned and entirely submitted in 5 cassettes.  (AK 06/19/2019)  Final Diagnosis performed by Gillie Manners, MD.   Electronically signed 06/24/2019 Technical and / or Professional components performed at [facility]. [facility], [place] [address] [place], [place] [address].  Immunohistochemistry Technical component (if applicable) was performed at [facility]. [address], [place], [place], [place] [address].   IMMUNOHISTOCHEMISTRY DISCLAIMER (if applicable): Some of these immunohistochemical stains may have been developed and the performance characteristics determine by [facility]. Some may not have been cleared or approved by the U.S. Food and Drug Administration. The FDA has determined that such clear                         ance or approval is not necessary. This test is used for clinical purposes. It should not be regarded as investigational or for research. This laboratory is certified under the [place] (CLIA-88) as qualified to perform high complexity clinical laboratory testing.  The controls stained appropriately.    Assessment:  Pearson Sweigert is a 49 y.o. female  with left breast DCISs/p excisional biopsy on 05/21/2019. Pathologyrevealed 2.8 cm grade II ductal carcinoma in situ (DCIS). The posterior/inferior margin was focally positive. DCIS was present <1 mm from the superior, medial and lateral margins. Stage was pTispNX. DCIS was ER+ (95%) and PR+ (100%).   Re-excisionon 06/19/2019 revealed afocus of residual DCISandatypical lobular hyperplasia/adenosisat the left posterior margin.There was residual DCISatthe left inferior margin. The fociof residual DCIS from the posterior and inferior margin werepresent at the cauterized black ink on the left posterior and inferiormargins.  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  Bilateral mammogram with left axillary ultrasoundon 02/14/2019 revealed duct ectasia in the 6-7 o'clock location of the LEFT breast without identifiable intraductal mass. There was a solid 0.7 x 0.5 x 0.7 cm mass in the 7 o'clock location 2 cm from the nipple in the LEFT breast. There was an enlarged LEFT axillary lymph node. There was a single duct spontaneous LEFT nipple discharge.  Bilateral breast MRI on 07/16/2019 revealed postoperative seroma in the central inferior left breast at the site of patient's lumpectomy for DCIS. Non-mass enhancement was seen surrounding the lumpectomy site involving the entire inferior left breast, spanning 4.0 x 5.8 x 4.3 cm. There was suspicious linear enhancement extending from the postoperative seroma into the nipple. There was indeterminate patchy non-mass enhancement extending into the upper inner quadrant(UIQ)of the left breast (1.3 x 1.2 cm). There were two indeterminate  enhancing masses in the upper inner and lower outer right breast. There was no suspicious lymphadenopathy.  Left breast biopsy on02/12/2021revealedfibrocystic changes in the upper inner quadrant(UIQ)and intermediate grade DCISin thelower outer quadrant (LOQ).DCIS wasER+ (95%)and PR+ (100%).  Right breast biopsyon 02/15/2021revealedatypical lobular hyperplasia, findings suggestive of lipoma,and fibrocystic changes in lower outer quadrant(LOQ).  She has a family historyof breast cancer (maternal grandmother age 46).  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  Patient received the Moderna COVID-19 vaccine on 08/16/2019 and 09/18/2019.   Symptomatically, she is doing well.  She is cutting back on smoking.  She has decided to pursue bilateral mastectomy.  Plan: 1. Left breast DCIS Initial pathology revealedgrade II DCIS with focally positive margin. Re-excisionpathology revealed persistent + margins (posterior and inferior). She has decided to pursue bilateral mastectomy after much thought. Discuss if pathology reveals only DCIS then no consideration for adjuvant endocrine therapy. 2. Family history of breast cancer Patient 47 with family history of breast cancer in a maternal grandmother at age 87. [facility] results revealed a variant of uncertain significance.   ATM c.7816A>G (p.lle2606Val) and gain (exons 62-63). 3.   RTC 2 weeks after surgery (patient to call) to review final pathology and direction of therapy.  I discussed the assessment and treatment plan with the patient.  The patient was provided an opportunity to ask questions and all were answered.  The patient agreed with the plan and demonstrated an understanding of the instructions.  The patient was advised to  call back if the symptoms worsen or if the condition fails to improve as anticipated.  I provided 17 minutes of face-to-face time during this this encounter and > 50% was spent counseling as documented under my assessment and plan. An additional 5 minutes were spent reviewing her chart (Epic and Care Everywhere) including notes, labs, and imaging studies.  Lequita Asal, MD, PhD    12/02/2019, 1:22 PM  I, Selena Batten, am acting as scribe for [employer]. Mike Gip, MD, PhD.  I, Shamra Bradeen C. Mike Gip, MD, have reviewed the above documentation for accuracy and completeness, and I agree with the above.

## 2019-12-02 ENCOUNTER — Other Ambulatory Visit: Payer: Self-pay

## 2019-12-02 ENCOUNTER — Inpatient Hospital Stay: Payer: 59 | Attending: Hematology and Oncology | Admitting: Hematology and Oncology

## 2019-12-02 ENCOUNTER — Encounter: Payer: Self-pay | Admitting: Hematology and Oncology

## 2019-12-02 VITALS — BP 114/73 | HR 85 | Temp 96.7°F | Resp 16 | Wt 112.9 lb

## 2019-12-02 DIAGNOSIS — Z79899 Other long term (current) drug therapy: Secondary | ICD-10-CM | POA: Insufficient documentation

## 2019-12-02 DIAGNOSIS — F329 Major depressive disorder, single episode, unspecified: Secondary | ICD-10-CM | POA: Diagnosis not present

## 2019-12-02 DIAGNOSIS — D0512 Intraductal carcinoma in situ of left breast: Secondary | ICD-10-CM | POA: Diagnosis not present

## 2019-12-02 DIAGNOSIS — Z803 Family history of malignant neoplasm of breast: Secondary | ICD-10-CM | POA: Diagnosis not present

## 2019-12-02 DIAGNOSIS — F17210 Nicotine dependence, cigarettes, uncomplicated: Secondary | ICD-10-CM | POA: Insufficient documentation

## 2019-12-02 DIAGNOSIS — F419 Anxiety disorder, unspecified: Secondary | ICD-10-CM | POA: Diagnosis not present

## 2019-12-02 DIAGNOSIS — Z170 Estrogen receptor positive status [ER+]: Secondary | ICD-10-CM | POA: Insufficient documentation

## 2019-12-02 NOTE — Progress Notes (Signed)
Patient here for oncology follow-up appointment, expresses no complaints or concerns at this time.    

## 2019-12-16 NOTE — Progress Notes (Signed)
ICD-10-CM   1. Ductal carcinoma in situ (DCIS) of left breast  D05.12   2. Cigarette nicotine dependence with nicotine-induced disorder  F17.219       Patient ID: Danielle Bishop, female    DOB: [birth year], 49 y.o.   MRN: [id number]   History of Present Illness: Danielle Bishop is a 49 y.o.  female  with a history of left-sided breast cancer.  She presents for preoperative evaluation for upcoming procedure, bilateral nipple sparing mastectomy with left sentinel lymph node biopsy with Dr. Barry Dienes and breast reconstruction with placement of tissue expander versus implant and Flex HD with Dr. Claudia Desanctis, scheduled for 01/01/2020.  Summary from previous visit: Patient is currently a smoker. If she can stop smoking we will evaluate the mastectomy flaps intraoperatively and if they are found to be well perfused it would be reasonable to proceed to direct implant reconstruction in prepectoral plane.  If she is unable to stop smoking, placing tissue expanders the day of surgery to minimize tension on mastectomy flaps would be more favorable.   Today patient reports that she had quit smoking after her last visit with Dr. Claudia Desanctis, but started smoking a little again, and then fully quit 4 days ago. She plans to stay not smoking forever. We discussed at length the issues with smoking and her recovery/healing.  Patient will have a Nicotine test prior to surgery. (Script given to patient to take to lab ~ 1 week prior to surgery)  Job: owns and manages a restaurant  PMH Significant for: anxiety, left breast cancer  The patient has not had problems with anesthesia.   Past Medical History: Allergies: No Known Allergies  Current Medications:  Current Outpatient Medications:  .  ALPRAZolam (XANAX) 0.25 MG tablet, Take 1 tablet (0.25 mg total) by mouth 2 (two) times daily as needed for anxiety., Disp: 20 tablet, Rfl: 1 .  sertraline (ZOLOFT) 50 MG tablet, Take 1 tablet (50 mg total) by mouth daily., Disp: 30 tablet, Rfl:  3  Past Medical Problems: Past Medical History:  Diagnosis Date  . Anxiety   . Breast cancer, left [facility])    dx 09/ 2020 by bx with atypical ductal hyperplasia/ papillnoma;   s/p  lefft breast excsional bx 05-21-2019 ,  dx DCIS  . Family history of breast cancer   . Family history of cancer of tongue   . Family history of skin cancer     Past Surgical History: Past Surgical History:  Procedure Laterality Date  . RADIOACTIVE SEED GUIDED EXCISIONAL BREAST BIOPSY Left 05/21/2019   Procedure: LEFT RADIOACTIVE SEED GUIDED EXCISIONAL BREAST BIOPSY;  Surgeon: Stark Klein, MD;  Location: [place];  Service: General;  Laterality: Left;  . RE-EXCISION OF BREAST LUMPECTOMY Left 06/19/2019   Procedure: RE-EXCISION OF LEFT BREAST LUMPECTOMY;  Surgeon: Stark Klein, MD;  Location: [place];  Service: General;  Laterality: Left;    Social History: Social History   Socioeconomic History  . Marital status: Married    Spouse name: Not on file  . Number of children: Not on file  . Years of education: Not on file  . Highest education level: Not on file  Occupational History  . Not on file  Tobacco Use  . Smoking status: Current Every Day Smoker    Packs/day: 0.50    Years: 20.00    Pack years: 10.00    Types: Cigarettes  . Smokeless tobacco: Never Used  Vaping Use  . Vaping Use:  Never used  Substance and Sexual Activity  . Alcohol use: Yes    Comment: occas  . Drug use: No  . Sexual activity: Yes    Birth control/protection: None  Other Topics Concern  . Not on file  Social History Narrative  . Not on file   Social Determinants of Health   Financial Resource Strain:   . Difficulty of Paying Living Expenses:   Food Insecurity:   . Worried About Charity fundraiser in the Last Year:   . Arboriculturist in the Last Year:   Transportation Needs:   . Film/video editor (Medical):   Marland Kitchen Lack of Transportation (Non-Medical):   Physical Activity:    . Days of Exercise per Week:   . Minutes of Exercise per Session:   Stress:   . Feeling of Stress :   Social Connections:   . Frequency of Communication with Friends and Family:   . Frequency of Social Gatherings with Friends and Family:   . Attends Religious Services:   . Active Member of Clubs or Organizations:   . Attends Archivist Meetings:   Marland Kitchen Marital Status:   Intimate Partner Violence:   . Fear of Current or Ex-Partner:   . Emotionally Abused:   Marland Kitchen Physically Abused:   . Sexually Abused:     Family History: Family History  Problem Relation Age of Onset  . Stroke Mother   . Breast cancer Maternal Grandmother 40  . Skin cancer Paternal Grandmother   . Skin cancer Paternal Grandfather   . Breast cancer Other   . Tongue cancer Other   . Cancer Other        unknown type  . Breast cancer Other 90  . Breast cancer Other     Review of Systems: Review of Systems  Constitutional: Negative for chills and fever.  HENT: Negative for congestion and sore throat.   Respiratory: Negative for cough and shortness of breath.   Cardiovascular: Negative for chest pain and palpitations.  Gastrointestinal: Negative for abdominal pain, nausea and vomiting.  Musculoskeletal: Negative for back pain, joint pain, myalgias and neck pain.  Skin: Negative for itching and rash.    Physical Exam: Vital Signs BP 105/70 (BP Location: Left Arm, Patient Position: Sitting, Cuff Size: Normal)   Pulse 85   Wt 114 lb 12.8 oz (52.1 kg)   SpO2 97%   BMI 20.34 kg/m  Physical Exam Constitutional:      General: She is not in acute distress.    Appearance: Normal appearance. She is normal weight.  HENT:     Head: Normocephalic and atraumatic.  Eyes:     Extraocular Movements: Extraocular movements intact.  Cardiovascular:     Rate and Rhythm: Normal rate and regular rhythm.     Pulses: Normal pulses.     Heart sounds: Normal heart sounds.  Pulmonary:     Effort: Pulmonary effort  is normal.     Breath sounds: Normal breath sounds. No wheezing, rhonchi or rales.  Abdominal:     General: Bowel sounds are normal.     Palpations: Abdomen is soft.  Musculoskeletal:        General: No swelling. Normal range of motion.     Cervical back: Normal range of motion.  Skin:    General: Skin is warm and dry.     Coloration: Skin is not pale.     Findings: No erythema or rash.  Neurological:  General: No focal deficit present.     Mental Status: She is alert and oriented to person, place, and time.  Psychiatric:        Mood and Affect: Mood normal.        Behavior: Behavior normal.        Thought Content: Thought content normal.        Judgment: Judgment normal.     Assessment/Plan:  Ms. Bokhari scheduled for bilateral nipple sparing mastectomy with left sentinel lymph node biopsy with Dr. Barry Dienes and breast reconstruction with placement of tissue expander versus implant and Flex HD with Dr. Claudia Desanctis.  Risks, benefits, and alternatives of procedure discussed, questions answered and consent obtained.    Smoking Status: quit 4 days ago; Counseling Given? Yes Last Mammogram: 07/16/19; Results: Right breast: Two dominant masses; Left breast: breast cancer  Caprini Score: 5 High; Risk Factors include: 49 yr-old female, hx cancer, BMI < 25, and length of planned surgery. Recommendation for mechanical and pharmacological prophylaxis during surgery. Encourage early ambulation.   Post-op Rx sent to pharmacy: Norco, Zofran, Keflex  PDMP reviewed:   Patient was provided with the Tissue Expander Risks and General Surgical Risks consent document and Pain Medication Agreement prior to their appointment.  They had adequate time to read through the risk consent documents and Pain Medication Agreement. We also discussed them in person together during this preop appointment. All of their questions were answered to their satisfaction.  Recommended calling if they have any further questions.   Risk consent form and Pain Medication Agreement to be scanned into patient's chart.  The risks that can be encountered with and after placement of a breast expander placement were discussed and include the following but not limited to these: bleeding, infection, delayed healing, anesthesia risks, skin sensation changes, injury to structures including nerves, blood vessels, and muscles which may be temporary or permanent, allergies to tape, suture materials and glues, blood products, topical preparations or injected agents, skin contour irregularities, skin discoloration and swelling, deep vein thrombosis, cardiac and pulmonary complications, pain, which may persist, fluid accumulation, wrinkling of the skin over the expander, changes in nipple or breast sensation, expander leakage or rupture, faulty position of the expander, persistent pain, formation of tight scar tissue around the expander (capsular contracture), possible need for revisional surgery or staged procedures.  The [place] was signed into law in 2016 which includes the topic of electronic health records.  This provides immediate access to information in MyChart.  This includes consultation notes, operative notes, office notes, lab results and pathology reports.  If you have any questions about what you read please let us know at your next visit or call us at the office.  We are right here with you.   Electronically signed by: Threasa Heads, PA-C 12/17/2019 4:12 PM

## 2019-12-17 ENCOUNTER — Encounter: Payer: Self-pay | Admitting: Plastic Surgery

## 2019-12-17 ENCOUNTER — Ambulatory Visit (INDEPENDENT_AMBULATORY_CARE_PROVIDER_SITE_OTHER): Payer: 59 | Admitting: Plastic Surgery

## 2019-12-17 ENCOUNTER — Other Ambulatory Visit: Payer: Self-pay

## 2019-12-17 VITALS — BP 105/70 | HR 85 | Wt 114.8 lb

## 2019-12-17 DIAGNOSIS — D0512 Intraductal carcinoma in situ of left breast: Secondary | ICD-10-CM

## 2019-12-17 DIAGNOSIS — F17219 Nicotine dependence, cigarettes, with unspecified nicotine-induced disorders: Secondary | ICD-10-CM

## 2019-12-17 MED ORDER — SULFAMETHOXAZOLE-TRIMETHOPRIM 400-80 MG PO TABS: 1.0000 | ORAL_TABLET | Freq: Two times a day (BID) | ORAL | 0 refills | Status: AC

## 2019-12-17 MED ORDER — HYDROCODONE-ACETAMINOPHEN 5-325 MG PO TABS: 1.0000 | ORAL_TABLET | Freq: Three times a day (TID) | ORAL | 0 refills | Status: AC | PRN

## 2019-12-17 MED ORDER — ONDANSETRON HCL 4 MG PO TABS: 4.0000 mg | ORAL_TABLET | Freq: Three times a day (TID) | ORAL | 0 refills | Status: DC | PRN

## 2019-12-25 ENCOUNTER — Other Ambulatory Visit: Payer: Self-pay

## 2019-12-25 ENCOUNTER — Encounter (HOSPITAL_BASED_OUTPATIENT_CLINIC_OR_DEPARTMENT_OTHER): Payer: Self-pay | Admitting: General Surgery

## 2019-12-27 ENCOUNTER — Other Ambulatory Visit: Payer: Self-pay | Admitting: Plastic Surgery

## 2019-12-27 NOTE — Progress Notes (Signed)

## 2019-12-28 ENCOUNTER — Other Ambulatory Visit (HOSPITAL_COMMUNITY)
Admission: RE | Admit: 2019-12-28 | Discharge: 2019-12-28 | Disposition: A | Discharge status: Home / Self Care | Payer: 59 | Source: Ambulatory Visit | Attending: General Surgery | Admitting: General Surgery

## 2019-12-28 DIAGNOSIS — Z01812 Encounter for preprocedural laboratory examination: Secondary | ICD-10-CM | POA: Insufficient documentation

## 2019-12-28 DIAGNOSIS — Z20822 Contact with and (suspected) exposure to covid-19: Secondary | ICD-10-CM | POA: Diagnosis not present

## 2019-12-28 LAB — SARS CORONAVIRUS 2 (TAT 6-24 HRS): SARS Coronavirus 2: NEGATIVE

## 2019-12-28 LAB — NICOTINE SCREEN, URINE: Cotinine Ql Scrn, Ur: POSITIVE ng/mL — AB

## 2019-12-30 NOTE — H&P (Signed)
Danielle Bishop Location: [facility] Patient #: [id number] DOB: 06-Feb-1971 Undefined / Language: Danielle Bishop / Race: White Female   History of Present Illness The patient is a 49 year old female who presents for a follow-up for Breast cancer. Pt is a 49 yo F who underwent seed localized excision for left nipple discharge 06/02/2019. 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 She had reexcision and unfortunately there was a mishap with her specimen and despite having one margin only inked, the entire specimen ended up inked. She had positive margins x 2, but again they were not oriented.  Because of the margin issue, her dense breast tissue, and age, I got an MRI before doing any more surgery to better evaluate. She had two biopsies on the left and one on the right. there was another abnormal area on the right, but it was too posterior to biopsy. I discussed this with Dr. Miquel Dunn. She is sore post bilateral biopsies. She has not yet started on anastrozole. Maternal grandmother wtih breast cancer is her only family cancer history.   She has now gotten genetics and this is negative. She is here to discuss.   In summary,  LEFT BREAST: She has a seroma where her DCIS was originally removed.  1. In the inferior left breast there are additional calcifications and non mass enhancement. The lower outer quadrant of this was biopsied and was DCIS.  2. There was suspicious enhancement going from seroma cavity to nipple that HAS NOT been biopsied as it is too superficial for perc biopsy.  3. A lesion in the upper inner quadrant was fibrocystic change and was concordant.  RIGHT BREAST: 1. Lower outer quadrant has an 8 mm area that has been biopsied and is ALH.  2. Upper inner quadrant has a 5 mm area that HAS NOT been biopsied as it it too posterior.    MRI breast 07/16/2019 IMPRESSION: 1. Postoperative seroma in the central inferior left breast at the site of patient's lumpectomy for DCIS. Non-mass  enhancement is seen surrounding the lumpectomy site involving the entire inferior left breast, spanning 4.0 x 5.8 x 4.3 cm. 2. Suspicious linear enhancement extending from the postoperative seroma into the nipple. 3. Indeterminate patchy non-mass enhancement extending into the upper inner quadrant of the left breast. 4. Two indeterminate enhancing masses in the upper inner and lower outer right breast. 5. No suspicious lymphadenopathy.    pathology 05/21/2019 . BREAST, LEFT, LUMPECTOMY: - Ductal carcinoma in situ, 2.8 cm, intermediate nuclear grade. - The posterior/inferior margin is focally positive for DCIS. - Biopsy site changes. - See oncology table.   Allergies  No Known Drug Allergies    Medication History  No Current Medications Medications Reconciled    Review of Systems  All other systems negative  Vitals Weight: 116.38 lb Height: 62.5in Body Surface Area: 1.53 m Body Mass Index: 20.95 kg/m  Temp.: 98.54F (Tympanic)  Pulse: 94 (Regular)  P.OX: 94% (Room air) BP: 118/78(Sitting, Left Arm, Standard)       Physical Exam  The physical exam findings are as follows: Note: entire exam spent in counseling.    Assessment & Plan  MALIGNANT NEOPLASM OF LOWER-OUTER QUADRANT OF LEFT BREAST OF FEMALE, ESTROGEN RECEPTOR POSITIVE (C50.512) Impression: Given the complexity of the left breast and amount of DCIS that appears to be present in the lower left breast, we recommend a mastectomy. This side will also need a sentinel lymph node biopsy.  She is a candidate for nipple sparing mastectomy. She  will follow up with Dr. Claudia Desanctis.  I reviewed risks of surgery which are primarily bleeding, infection, damage to adjacent structures, chronic pain, recurrent cancer, swelling.  35 min spent in evaluation, examination, counseling, and coordination of care. >50% spent in counseling. ABNORMAL MAGNETIC RESONANCE IMAGING OF BREAST, BILATERAL (R92.8) Impression:  The right side lower outer quadrant lesion is ALH and will need excision with seed loc. The upper inner lesion has not been sampled and would need seed localized biopsy as well.  She has decided to have mastectomy on the right side as well.

## 2019-12-31 NOTE — Anesthesia Preprocedure Evaluation (Addendum)
Anesthesia Evaluation  Patient identified by MRN, date of birth, ID band Patient awake    Reviewed: Allergy & Precautions, NPO status , Patient's Chart, lab work & pertinent test results  Airway Mallampati: II  TM Distance: >3 FB Neck ROM: Full    Dental no notable dental hx. (+) Teeth Intact, Dental Advisory Given   Pulmonary Current SmokerPatient did not abstain from smoking.,    Pulmonary exam normal breath sounds clear to auscultation       Cardiovascular Exercise Tolerance: Good Normal cardiovascular exam Rhythm:Regular Rate:Normal     Neuro/Psych PSYCHIATRIC DISORDERS Anxiety    GI/Hepatic   Endo/Other  Left Breast Ca  Renal/GU      Musculoskeletal   Abdominal   Peds  Hematology   Anesthesia Other Findings   Reproductive/Obstetrics                           Anesthesia Physical  Anesthesia Plan  ASA: II  Anesthesia Plan: General   Post-op Pain Management: GA combined w/ Regional for post-op pain   Induction: Intravenous  PONV Risk Score and Plan: 3 and Treatment may vary due to age or medical condition, Ondansetron, Dexamethasone, Scopolamine patch - Pre-op, Midazolam and Propofol infusion  Airway Management Planned: LMA and Oral ETT  Additional Equipment:   Intra-op Plan:   Post-operative Plan: Extubation in OR  Informed Consent: I have reviewed the patients History and Physical, chart, labs and discussed the procedure including the risks, benefits and alternatives for the proposed anesthesia with the patient or authorized representative who has indicated his/her understanding and acceptance.     Dental advisory given  Plan Discussed with: CRNA, Surgeon and Anesthesiologist  Anesthesia Plan Comments:       Anesthesia Quick Evaluation

## 2020-01-01 ENCOUNTER — Observation Stay (HOSPITAL_BASED_OUTPATIENT_CLINIC_OR_DEPARTMENT_OTHER)
Admission: RE | Admit: 2020-01-01 | Discharge: 2020-01-02 | Disposition: A | Discharge status: Home / Self Care | Payer: 59 | Attending: Plastic Surgery | Admitting: Plastic Surgery

## 2020-01-01 ENCOUNTER — Encounter (HOSPITAL_BASED_OUTPATIENT_CLINIC_OR_DEPARTMENT_OTHER)
Admission: RE | Disposition: A | Discharge status: Home / Self Care | Payer: Self-pay | Source: Home / Self Care | Attending: Plastic Surgery

## 2020-01-01 ENCOUNTER — Other Ambulatory Visit: Payer: Self-pay

## 2020-01-01 ENCOUNTER — Ambulatory Visit (HOSPITAL_BASED_OUTPATIENT_CLINIC_OR_DEPARTMENT_OTHER): Payer: 59 | Admitting: Anesthesiology

## 2020-01-01 ENCOUNTER — Encounter (HOSPITAL_COMMUNITY)
Admission: RE | Admit: 2020-01-01 | Discharge: 2020-01-01 | Disposition: A | Discharge status: Home / Self Care | Payer: 59 | Source: Ambulatory Visit | Attending: General Surgery | Admitting: General Surgery

## 2020-01-01 ENCOUNTER — Encounter (HOSPITAL_BASED_OUTPATIENT_CLINIC_OR_DEPARTMENT_OTHER): Payer: Self-pay | Admitting: General Surgery

## 2020-01-01 DIAGNOSIS — C50011 Malignant neoplasm of nipple and areola, right female breast: Principal | ICD-10-CM | POA: Insufficient documentation

## 2020-01-01 DIAGNOSIS — Z170 Estrogen receptor positive status [ER+]: Secondary | ICD-10-CM

## 2020-01-01 DIAGNOSIS — C50012 Malignant neoplasm of nipple and areola, left female breast: Secondary | ICD-10-CM | POA: Diagnosis not present

## 2020-01-01 DIAGNOSIS — C50919 Malignant neoplasm of unspecified site of unspecified female breast: Secondary | ICD-10-CM | POA: Diagnosis present

## 2020-01-01 DIAGNOSIS — Z9013 Acquired absence of bilateral breasts and nipples: Secondary | ICD-10-CM

## 2020-01-01 DIAGNOSIS — D0512 Intraductal carcinoma in situ of left breast: Secondary | ICD-10-CM

## 2020-01-01 HISTORY — PX: BREAST RECONSTRUCTION WITH PLACEMENT OF TISSUE EXPANDER AND FLEX HD (ACELLULAR HYDRATED DERMIS): SHX6295

## 2020-01-01 HISTORY — PX: MASTECTOMY W/ SENTINEL NODE BIOPSY: SHX2001

## 2020-01-01 LAB — POCT PREGNANCY, URINE: Preg Test, Ur: NEGATIVE

## 2020-01-01 SURGERY — MASTECTOMY WITH SENTINEL LYMPH NODE BIOPSY
Anesthesia: General | Site: Breast | Laterality: Bilateral

## 2020-01-01 MED ORDER — MIDAZOLAM HCL 2 MG/2ML IJ SOLN
2.0000 mg | Freq: Once | INTRAMUSCULAR | Status: AC
  Administered 2020-01-01: 2 mg via INTRAVENOUS

## 2020-01-01 MED ORDER — DEXAMETHASONE SODIUM PHOSPHATE 10 MG/ML IJ SOLN
INTRAMUSCULAR | Status: AC
  Filled 2020-01-01: qty 1

## 2020-01-01 MED ORDER — DIPHENHYDRAMINE HCL 50 MG/ML IJ SOLN
INTRAMUSCULAR | Status: AC
  Filled 2020-01-01: qty 1

## 2020-01-01 MED ORDER — OXYCODONE HCL 5 MG PO TABS: 5.0000 mg | ORAL_TABLET | Freq: Once | ORAL | Status: DC | PRN

## 2020-01-01 MED ORDER — OXYCODONE HCL 5 MG/5ML PO SOLN: 5.0000 mg | Freq: Once | ORAL | Status: DC | PRN

## 2020-01-01 MED ORDER — ACETAMINOPHEN 325 MG PO TABS: 325.0000 mg | ORAL_TABLET | ORAL | Status: DC | PRN

## 2020-01-01 MED ORDER — ONDANSETRON 4 MG PO TBDP: 4.0000 mg | ORAL_TABLET | Freq: Four times a day (QID) | ORAL | Status: DC | PRN

## 2020-01-01 MED ORDER — EPHEDRINE SULFATE 50 MG/ML IJ SOLN
INTRAMUSCULAR | Status: DC | PRN
  Administered 2020-01-01: 10 mg via INTRAVENOUS

## 2020-01-01 MED ORDER — ROCURONIUM BROMIDE 100 MG/10ML IV SOLN
INTRAVENOUS | Status: DC | PRN
  Administered 2020-01-01: 60 mg via INTRAVENOUS

## 2020-01-01 MED ORDER — ENSURE PRE-SURGERY PO LIQD: 296.0000 mL | Freq: Once | ORAL | Status: DC

## 2020-01-01 MED ORDER — PROPOFOL 10 MG/ML IV BOLUS
INTRAVENOUS | Status: DC | PRN
  Administered 2020-01-01: 150 mg via INTRAVENOUS

## 2020-01-01 MED ORDER — FENTANYL CITRATE (PF) 100 MCG/2ML IJ SOLN: 25.0000 ug | INTRAMUSCULAR | Status: DC | PRN

## 2020-01-01 MED ORDER — ROCURONIUM BROMIDE 10 MG/ML (PF) SYRINGE
PREFILLED_SYRINGE | INTRAVENOUS | Status: AC
  Filled 2020-01-01: qty 10

## 2020-01-01 MED ORDER — HYDROCODONE-ACETAMINOPHEN 5-325 MG PO TABS
ORAL_TABLET | ORAL | Status: AC
  Filled 2020-01-01: qty 1

## 2020-01-01 MED ORDER — BUPIVACAINE-EPINEPHRINE (PF) 0.25% -1:200000 IJ SOLN
INTRAMUSCULAR | Status: DC | PRN
  Administered 2020-01-01 (×2): 20 mL via PERINEURAL

## 2020-01-01 MED ORDER — EPHEDRINE 5 MG/ML INJ
INTRAVENOUS | Status: AC
  Filled 2020-01-01: qty 10

## 2020-01-01 MED ORDER — CHLORHEXIDINE GLUCONATE CLOTH 2 % EX PADS: 6.0000 | MEDICATED_PAD | Freq: Once | CUTANEOUS | Status: DC

## 2020-01-01 MED ORDER — GABAPENTIN 300 MG PO CAPS
300.0000 mg | ORAL_CAPSULE | ORAL | Status: AC
  Administered 2020-01-01: 300 mg via ORAL

## 2020-01-01 MED ORDER — CEFAZOLIN SODIUM-DEXTROSE 2-4 GM/100ML-% IV SOLN
2.0000 g | INTRAVENOUS | Status: AC
  Administered 2020-01-01 (×2): 2 g via INTRAVENOUS

## 2020-01-01 MED ORDER — MIDAZOLAM HCL 2 MG/2ML IJ SOLN
INTRAMUSCULAR | Status: AC
  Filled 2020-01-01: qty 2

## 2020-01-01 MED ORDER — LIDOCAINE 2% (20 MG/ML) 5 ML SYRINGE
INTRAMUSCULAR | Status: AC
  Filled 2020-01-01: qty 5

## 2020-01-01 MED ORDER — GABAPENTIN 300 MG PO CAPS
ORAL_CAPSULE | ORAL | Status: AC
  Filled 2020-01-01: qty 1

## 2020-01-01 MED ORDER — SODIUM CHLORIDE 0.9 % IV SOLN: INTRAVENOUS | Status: DC | PRN

## 2020-01-01 MED ORDER — SODIUM CHLORIDE (PF) 0.9 % IJ SOLN: INTRAMUSCULAR | Status: DC | PRN

## 2020-01-01 MED ORDER — CEFAZOLIN SODIUM-DEXTROSE 2-4 GM/100ML-% IV SOLN
INTRAVENOUS | Status: AC
  Filled 2020-01-01: qty 100

## 2020-01-01 MED ORDER — PROPOFOL 500 MG/50ML IV EMUL
INTRAVENOUS | Status: AC
  Filled 2020-01-01: qty 50

## 2020-01-01 MED ORDER — ACETAMINOPHEN 500 MG PO TABS
1000.0000 mg | ORAL_TABLET | ORAL | Status: AC
  Administered 2020-01-01: 1000 mg via ORAL

## 2020-01-01 MED ORDER — PHENYLEPHRINE 40 MCG/ML (10ML) SYRINGE FOR IV PUSH (FOR BLOOD PRESSURE SUPPORT)
PREFILLED_SYRINGE | INTRAVENOUS | Status: AC
  Filled 2020-01-01: qty 10

## 2020-01-01 MED ORDER — ONDANSETRON HCL 4 MG/2ML IJ SOLN
INTRAMUSCULAR | Status: AC
  Filled 2020-01-01: qty 2

## 2020-01-01 MED ORDER — SULFAMETHOXAZOLE-TRIMETHOPRIM 800-160 MG PO TABS
1.0000 | ORAL_TABLET | Freq: Two times a day (BID) | ORAL | Status: DC
  Filled 2020-01-01: qty 1

## 2020-01-01 MED ORDER — MIDAZOLAM HCL 5 MG/5ML IJ SOLN
INTRAMUSCULAR | Status: DC | PRN
  Administered 2020-01-01: 2 mg via INTRAVENOUS

## 2020-01-01 MED ORDER — ACETAMINOPHEN 500 MG PO TABS
ORAL_TABLET | ORAL | Status: AC
  Filled 2020-01-01: qty 2

## 2020-01-01 MED ORDER — EPHEDRINE SULFATE 50 MG/ML IJ SOLN
INTRAMUSCULAR | Status: DC | PRN
Start: 2020-01-01 — End: 2020-01-01
  Administered 2020-01-01: 10 mg via INTRAVENOUS

## 2020-01-01 MED ORDER — LIDOCAINE HCL (CARDIAC) PF 100 MG/5ML IV SOSY
PREFILLED_SYRINGE | INTRAVENOUS | Status: DC | PRN
  Administered 2020-01-01: 60 mg via INTRAVENOUS

## 2020-01-01 MED ORDER — ACETAMINOPHEN 325 MG PO TABS: 650.0000 mg | ORAL_TABLET | Freq: Four times a day (QID) | ORAL | Status: DC | PRN

## 2020-01-01 MED ORDER — SULFAMETHOXAZOLE-TRIMETHOPRIM 800-160 MG PO TABS
1.0000 | ORAL_TABLET | Freq: Two times a day (BID) | ORAL | Status: DC
  Administered 2020-01-01: 1 via ORAL
  Filled 2020-01-01: qty 1

## 2020-01-01 MED ORDER — SODIUM CHLORIDE 0.9 % IV SOLN
INTRAVENOUS | Status: AC | PRN
  Administered 2020-01-01: 100 mL via INTRAMUSCULAR

## 2020-01-01 MED ORDER — ONDANSETRON HCL 4 MG/2ML IJ SOLN
INTRAMUSCULAR | Status: DC | PRN
  Administered 2020-01-01: 4 mg via INTRAVENOUS

## 2020-01-01 MED ORDER — ACETAMINOPHEN 160 MG/5ML PO SOLN: 325.0000 mg | ORAL | Status: DC | PRN

## 2020-01-01 MED ORDER — SUCCINYLCHOLINE CHLORIDE 200 MG/10ML IV SOSY
PREFILLED_SYRINGE | INTRAVENOUS | Status: AC
  Filled 2020-01-01: qty 10

## 2020-01-01 MED ORDER — ONDANSETRON HCL 4 MG/2ML IJ SOLN: 4.0000 mg | Freq: Four times a day (QID) | INTRAMUSCULAR | Status: DC | PRN

## 2020-01-01 MED ORDER — HYDROCODONE-ACETAMINOPHEN 5-325 MG PO TABS
1.0000 | ORAL_TABLET | ORAL | Status: DC | PRN
  Administered 2020-01-01: 1 via ORAL
  Administered 2020-01-02: 2 via ORAL
  Filled 2020-01-01: qty 2

## 2020-01-01 MED ORDER — SUFENTANIL CITRATE 50 MCG/ML IV SOLN
INTRAVENOUS | Status: DC | PRN
  Administered 2020-01-01 (×2): 10 ug via INTRAVENOUS

## 2020-01-01 MED ORDER — MORPHINE SULFATE (PF) 4 MG/ML IV SOLN: 1.0000 mg | INTRAVENOUS | Status: DC | PRN

## 2020-01-01 MED ORDER — FENTANYL CITRATE (PF) 100 MCG/2ML IJ SOLN
INTRAMUSCULAR | Status: AC
  Filled 2020-01-01: qty 2

## 2020-01-01 MED ORDER — DEXAMETHASONE SODIUM PHOSPHATE 4 MG/ML IJ SOLN
INTRAMUSCULAR | Status: DC | PRN
  Administered 2020-01-01: 10 mg via INTRAVENOUS

## 2020-01-01 MED ORDER — ACETAMINOPHEN 325 MG RE SUPP: 650.0000 mg | Freq: Four times a day (QID) | RECTAL | Status: DC | PRN

## 2020-01-01 MED ORDER — SUFENTANIL CITRATE 50 MCG/ML IV SOLN
INTRAVENOUS | Status: AC
  Filled 2020-01-01: qty 1

## 2020-01-01 MED ORDER — LACTATED RINGERS IV SOLN: INTRAVENOUS | Status: DC

## 2020-01-01 MED ORDER — ONDANSETRON HCL 4 MG/2ML IJ SOLN
4.0000 mg | Freq: Once | INTRAMUSCULAR | Status: DC | PRN
  Filled 2020-01-01: qty 2

## 2020-01-01 MED ORDER — TECHNETIUM TC 99M SULFUR COLLOID FILTERED
1.0000 | Freq: Once | INTRAVENOUS | Status: AC | PRN
  Administered 2020-01-01: 1 via INTRADERMAL

## 2020-01-01 MED ORDER — MEPERIDINE HCL 25 MG/ML IJ SOLN: 6.2500 mg | INTRAMUSCULAR | Status: DC | PRN

## 2020-01-01 MED ORDER — FENTANYL CITRATE (PF) 100 MCG/2ML IJ SOLN
100.0000 ug | Freq: Once | INTRAMUSCULAR | Status: AC
  Administered 2020-01-01: 100 ug via INTRAVENOUS

## 2020-01-01 MED ORDER — SUGAMMADEX SODIUM 200 MG/2ML IV SOLN
INTRAVENOUS | Status: DC | PRN
Start: 2020-01-01 — End: 2020-01-01
  Administered 2020-01-01: 150 mg via INTRAVENOUS

## 2020-01-01 MED ORDER — BUPIVACAINE LIPOSOME 1.3 % IJ SUSP
INTRAMUSCULAR | Status: DC | PRN
Start: 2020-01-01 — End: 2020-01-01
  Administered 2020-01-01 (×2): 10 mL

## 2020-01-01 SURGICAL SUPPLY — 108 items
BAG DECANTER FOR FLEXI CONT (MISCELLANEOUS) ×4 IMPLANT
BINDER BREAST LRG (GAUZE/BANDAGES/DRESSINGS) IMPLANT
BINDER BREAST MEDIUM (GAUZE/BANDAGES/DRESSINGS) IMPLANT
BINDER BREAST XLRG (GAUZE/BANDAGES/DRESSINGS) IMPLANT
BINDER BREAST XXLRG (GAUZE/BANDAGES/DRESSINGS) IMPLANT
BIOPATCH RED 1 DISK 7.0 (GAUZE/BANDAGES/DRESSINGS) ×3 IMPLANT
BIOPATCH RED 1IN DISK 7.0MM (GAUZE/BANDAGES/DRESSINGS) ×1
BLADE HEX COATED 2.75 (ELECTRODE) ×4 IMPLANT
BLADE SURG 10 STRL SS (BLADE) ×4 IMPLANT
BLADE SURG 15 STRL LF DISP TIS (BLADE) ×4 IMPLANT
BLADE SURG 15 STRL SS (BLADE) ×4
BNDG COHESIVE 4X5 TAN STRL (GAUZE/BANDAGES/DRESSINGS) ×4 IMPLANT
BNDG ELASTIC 6X5.8 VLCR STR LF (GAUZE/BANDAGES/DRESSINGS) IMPLANT
BNDG GAUZE ELAST 4 BULKY (GAUZE/BANDAGES/DRESSINGS) ×8 IMPLANT
CANISTER SUCT 1200ML W/VALVE (MISCELLANEOUS) ×4 IMPLANT
CHG Tegaderm Gel Pad ×8 IMPLANT
CHLORAPREP W/TINT 26 (MISCELLANEOUS) ×8 IMPLANT
CLIP VESOCCLUDE LG 6/CT (CLIP) ×4 IMPLANT
CLIP VESOCCLUDE MED 6/CT (CLIP) ×8 IMPLANT
CLIP VESOCCLUDE SM WIDE 6/CT (CLIP) IMPLANT
CLOSURE WOUND 1/2 X4 (GAUZE/BANDAGES/DRESSINGS)
COVER BACK TABLE 60X90IN (DRAPES) ×4 IMPLANT
COVER MAYO STAND STRL (DRAPES) ×4 IMPLANT
COVER PROBE W GEL 5X96 (DRAPES) ×4 IMPLANT
COVER WAND RF STERILE (DRAPES) IMPLANT
DECANTER SPIKE VIAL GLASS SM (MISCELLANEOUS) IMPLANT
DERMABOND ADVANCED (GAUZE/BANDAGES/DRESSINGS) ×2
DERMABOND ADVANCED .7 DNX12 (GAUZE/BANDAGES/DRESSINGS) ×2 IMPLANT
DRAIN CHANNEL 15F RND FF W/TCR (WOUND CARE) ×8 IMPLANT
DRAIN CHANNEL 19F RND (DRAIN) IMPLANT
DRAPE HALF SHEET 70X43 (DRAPES) ×4 IMPLANT
DRAPE TOP ARMCOVERS (MISCELLANEOUS) ×4 IMPLANT
DRAPE UTILITY XL STRL (DRAPES) ×4 IMPLANT
DRESSING PRVNA BELLAFORM 21X19 (GAUZE/BANDAGES/DRESSINGS) ×2 IMPLANT
DRSG PAD ABDOMINAL 8X10 ST (GAUZE/BANDAGES/DRESSINGS) IMPLANT
DRSG PREVENA BELLAFORM 21X19 (GAUZE/BANDAGES/DRESSINGS) ×4
DRSG TEGADERM 4X10 (GAUZE/BANDAGES/DRESSINGS) IMPLANT
DRSG TEGADERM 4X4.75 (GAUZE/BANDAGES/DRESSINGS) IMPLANT
ELECT BLADE 4.0 EZ CLEAN MEGAD (MISCELLANEOUS) ×4
ELECT COATED BLADE 2.86 ST (ELECTRODE) IMPLANT
ELECT REM PT RETURN 9FT ADLT (ELECTROSURGICAL) ×4
ELECTRODE BLDE 4.0 EZ CLN MEGD (MISCELLANEOUS) ×2 IMPLANT
ELECTRODE REM PT RTRN 9FT ADLT (ELECTROSURGICAL) ×2 IMPLANT
EVACUATOR SILICONE 100CC (DRAIN) ×8 IMPLANT
EXPANDER BREAST 275CC (Breast) ×8 IMPLANT
FUNNEL KELLER 2 DISP (MISCELLANEOUS) IMPLANT
GAUZE SPONGE 4X4 12PLY STRL (GAUZE/BANDAGES/DRESSINGS) IMPLANT
GLOVE BIO SURGEON STRL SZ 6 (GLOVE) ×4 IMPLANT
GLOVE BIO SURGEON STRL SZ7 (GLOVE) IMPLANT
GLOVE BIO SURGEON STRL SZ7.5 (GLOVE) IMPLANT
GLOVE BIOGEL M STRL SZ7.5 (GLOVE) ×8 IMPLANT
GLOVE BIOGEL PI IND STRL 6.5 (GLOVE) ×2 IMPLANT
GLOVE BIOGEL PI IND STRL 8 (GLOVE) ×4 IMPLANT
GLOVE BIOGEL PI INDICATOR 6.5 (GLOVE) ×2
GLOVE BIOGEL PI INDICATOR 8 (GLOVE) ×4
GOWN STRL REUS W/ TWL LRG LVL3 (GOWN DISPOSABLE) ×8 IMPLANT
GOWN STRL REUS W/TWL 2XL LVL3 (GOWN DISPOSABLE) ×4 IMPLANT
GOWN STRL REUS W/TWL LRG LVL3 (GOWN DISPOSABLE) ×8
GRAFT FLEX HD 17.5X20X1.5-2.2 (Tissue) ×4 IMPLANT
IV NS 500ML (IV SOLUTION) ×4
IV NS 500ML BAXH (IV SOLUTION) ×4 IMPLANT
KIT FILL SYSTEM UNIVERSAL (SET/KITS/TRAYS/PACK) IMPLANT
LIGHT WAVEGUIDE WIDE FLAT (MISCELLANEOUS) ×4 IMPLANT
MARKER SKIN DUAL TIP RULER LAB (MISCELLANEOUS) IMPLANT
NDL SAFETY ECLIPSE 18X1.5 (NEEDLE) ×2 IMPLANT
NEEDLE HYPO 18GX1.5 SHARP (NEEDLE) ×2
NEEDLE HYPO 25X1 1.5 SAFETY (NEEDLE) ×4 IMPLANT
NEEDLE SPNL 18GX3.5 QUINCKE PK (NEEDLE) IMPLANT
NEEDLE SPNL 22GX3.5 QUINCKE BK (NEEDLE) IMPLANT
NS IRRIG 1000ML POUR BTL (IV SOLUTION) ×4 IMPLANT
PACK BASIN DAY SURGERY FS (CUSTOM PROCEDURE TRAY) ×4 IMPLANT
PACK SPY-PHI (KITS) ×4 IMPLANT
PACK UNIVERSAL I (CUSTOM PROCEDURE TRAY) ×4 IMPLANT
PENCIL SMOKE EVACUATOR (MISCELLANEOUS) ×4 IMPLANT
PIN SAFETY STERILE (MISCELLANEOUS) IMPLANT
RETRACTOR ONETRAX LX 135X30 (MISCELLANEOUS) IMPLANT
SHEET MEDIUM DRAPE 40X70 STRL (DRAPES) IMPLANT
SLEEVE SCD COMPRESS KNEE MED (MISCELLANEOUS) ×4 IMPLANT
SPONGE LAP 18X18 RF (DISPOSABLE) ×16 IMPLANT
STAPLER VISISTAT 35W (STAPLE) IMPLANT
STOCKINETTE IMPERVIOUS LG (DRAPES) ×4 IMPLANT
STRIP CLOSURE SKIN 1/2X4 (GAUZE/BANDAGES/DRESSINGS) IMPLANT
SUT ETHILON 2 0 FS 18 (SUTURE) IMPLANT
SUT ETHILON 3 0 PS 1 (SUTURE) ×8 IMPLANT
SUT MNCRL AB 4-0 PS2 18 (SUTURE) IMPLANT
SUT MON AB 3-0 SH 27 (SUTURE) ×4
SUT MON AB 3-0 SH27 (SUTURE) ×4 IMPLANT
SUT MON AB 4-0 PC3 18 (SUTURE) IMPLANT
SUT PDS 3-0 CT2 (SUTURE) ×24
SUT PDS II 3-0 CT2 27 ABS (SUTURE) ×12 IMPLANT
SUT PLAIN 5 0 P 3 18 (SUTURE) IMPLANT
SUT SILK 0 TIES 10X30 (SUTURE) IMPLANT
SUT SILK 2 0 SH (SUTURE) ×4 IMPLANT
SUT VICRYL 3-0 CR8 SH (SUTURE) IMPLANT
SUT VICRYL AB 2 0 TIE (SUTURE) IMPLANT
SUT VICRYL AB 2 0 TIES (SUTURE)
SUT VLOC 90 P-14 23 (SUTURE) ×8 IMPLANT
SYR BULB EAR ULCER 3OZ GRN STR (SYRINGE) ×4 IMPLANT
SYR BULB IRRIG 60ML STRL (SYRINGE) ×4 IMPLANT
SYR CONTROL 10ML LL (SYRINGE) ×4 IMPLANT
TAPE MEASURE VINYL STERILE (MISCELLANEOUS) IMPLANT
TOWEL GREEN STERILE FF (TOWEL DISPOSABLE) ×8 IMPLANT
TRAY FOLEY W/BAG SLVR 14FR (SET/KITS/TRAYS/PACK) ×4 IMPLANT
TRAY FOLEY W/BAG SLVR 14FR LF (SET/KITS/TRAYS/PACK) IMPLANT
TUBE CONNECTING 20'X1/4 (TUBING) ×1
TUBE CONNECTING 20X1/4 (TUBING) ×3 IMPLANT
UNDERPAD 30X36 HEAVY ABSORB (UNDERPADS AND DIAPERS) ×8 IMPLANT
YANKAUER SUCT BULB TIP NO VENT (SUCTIONS) ×4 IMPLANT

## 2020-01-01 NOTE — Progress Notes (Signed)
Nuc med at bedside

## 2020-01-01 NOTE — Anesthesia Procedure Notes (Signed)
Anesthesia Regional Block: Pectoralis block   Pre-Anesthetic Checklist: ,, timeout performed, Correct Patient, Correct Site, Correct Laterality, Correct Procedure, Correct Position, site marked, Risks and benefits discussed,  Surgical consent,  Pre-op evaluation,  At surgeon's request and post-op pain management  Laterality: Left and Right  Prep: chloraprep       Needles:  Injection technique: Single-shot  Needle Type: Echogenic Stimulator Needle     Needle Length: 5cm  Needle Gauge: 22     Additional Needles:   Procedures:, nerve stimulator,,, ultrasound used (permanent image in chart),,,,  Narrative:  Start time: 01/01/2020 7:55 AM End time: 01/01/2020 8:15 AM Injection made incrementally with aspirations every 5 mL.  Performed by: Personally  Anesthesiologist: Janeece Riggers, MD  Additional Notes: Functioning IV was confirmed and monitors were applied.  A 23mm 22ga Arrow echogenic stimulator needle was used. Sterile prep and drape,hand hygiene and sterile gloves were used. Ultrasound guidance: relevant anatomy identified, needle position confirmed, local anesthetic spread visualized around nerve(s)., vascular puncture avoided.  Image printed for medical record. Negative aspiration and negative test dose prior to incremental administration of local anesthetic. The patient tolerated the procedure well.

## 2020-01-01 NOTE — Interval H&P Note (Signed)
History and Physical Interval Note:  01/01/2020 8:50 AM  Danielle Bishop  has presented today for surgery, with the diagnosis of LEFT BREAST CANCER, RIGHT ABNORMAL MRI.  The various methods of treatment have been discussed with the patient and family. After consideration of risks, benefits and other options for treatment, the patient has consented to  Procedure(s) with comments: BILATERAL skin SPARING MASTECTOMY WITH LEFT SENTINEL LYMPH NODE BIOPSY as a surgical intervention.  The patient's history has been reviewed, patient examined, no change in status, stable for surgery.  I have reviewed the patient's chart and labs.  Questions were answered to the patient's satisfaction.     Stark Klein

## 2020-01-01 NOTE — Anesthesia Postprocedure Evaluation (Signed)
Anesthesia Post Note  Patient: Danielle Bishop  Procedure(s) Performed: BILATERAL NIPPLE SPARING MASTECTOMY WITH LEFT SENTINEL LYMPH NODE BIOPSY (Bilateral Breast) BREAST RECONSTRUCTION WITH PLACEMENT OF TISSUE EXPANDER AND FLEX HD (ACELLULAR HYDRATED DERMIS) (Bilateral Breast)     Patient location during evaluation: PACU Anesthesia Type: General Level of consciousness: awake and alert Pain management: pain level controlled Vital Signs Assessment: post-procedure vital signs reviewed and stable Respiratory status: spontaneous breathing, nonlabored ventilation, respiratory function stable and patient connected to nasal cannula oxygen Cardiovascular status: blood pressure returned to baseline and stable Postop Assessment: no apparent nausea or vomiting Anesthetic complications: no   No complications documented.  Last Vitals:  Vitals:   01/01/20 1545 01/01/20 1634  BP: 126/75 123/66  Pulse: 82 84  Resp: (!) 0 16  Temp:  37.2 C  SpO2: 97% 97%    Last Pain:  Vitals:   01/01/20 1634  TempSrc:   PainSc: 5                  Elliot Meldrum

## 2020-01-01 NOTE — Progress Notes (Signed)
Assisted Dr. Oddono with right, left, ultrasound guided, pectoralis block. Side rails up, monitors on throughout procedure. See vital signs in flow sheet. Tolerated Procedure well. 

## 2020-01-01 NOTE — Op Note (Signed)
Bilateral Skin sparing Mastectomy with Left Sentinel Node Biopsy Procedure Note  Indications: This patient presents with history of left breast cancer, lower inner quadrant, cTis+/+, ALH in right breast, dense breasts  Pre-operative Diagnosis: Left breast cancer as above  Post-operative Diagnosis: same  Surgeon: Stark Klein   Assistant: Carlena Hurl, PA-C  Anesthesia: General endotracheal anesthesia and pectoral block  ASA Class: 2  Procedure Details  The patient was seen in the Holding Room. The risks, benefits, complications, treatment options, and expected outcomes were discussed with the patient. The possibilities of reaction to medication, pulmonary aspiration, bleeding, infection, the need for additional procedures, failure to diagnose a condition, and creating a complication requiring transfusion or operation were discussed with the patient. The patient concurred with the proposed plan, giving informed consent.  The site of surgery properly noted/marked. The patient was taken to Operating Room # 2, identified as Nivea Wojdyla and the procedure verified as Bilateral Mastectomy and Left Sentinel Node Biopsy. A Time Out was held and the above information confirmed.    After induction of anesthesia, the left arm, bilateral breast and chest were prepped and draped in standard fashion.   The borders of the breast were identified and marked.  The right side (benign) was addressed first. A circular incision was made around the nipple.  Skin hooks were used to elevate the edges of the skin.  The cautery was used to perform the dissection to create the skin flaps.  The dissection was taken to the fascia of the pectoralis major.  The penetrating vessels were clipped as needed.  The superior flap was taken medially to the lateral sternal border, superiorly to the inferior border of the clavicle.  The inferior flap was similarly created, inferiorly to the inframammary fold and laterally to the border of the  latissimus.  The breast was taken off including the pectoralis fascia and the axillary tail marked.    The left side was then addressed similarly.    Using a hand-held gamma probe, axillary sentinel nodes were identified. The nodes were accessed via the circumareolar incision.   Two deep level 2 axillary sentinel nodes were removed and submitted to pathology.  The findings are below.  The lymphovascular channels were clipped with metal clips.    Hemostasis was achieved with cautery.      The patient was left with Dr. Claudia Desanctis for placement of expanders and closure  Findings: grossly clear surgical margins.  Very dense breast tissue.  Minimal subcutaneous fat.  SLN #1 hot, cps 69; SLN #2 hot, cps 491  Estimated Blood Loss: min          Drains: per Dr. Claudia Desanctis.                Specimens: Right breast, left breast, and two deep left axillary sentinel nodes         Complications:  None; patient tolerated the procedure well.         Disposition: PACU - hemodynamically stable.         Condition: stable

## 2020-01-01 NOTE — Anesthesia Procedure Notes (Addendum)
Procedure Name: Intubation Date/Time: 01/01/2020 9:15 AM Performed by: Willa Frater, CRNA Pre-anesthesia Checklist: Patient identified, Emergency Drugs available, Suction available and Patient being monitored Patient Re-evaluated:Patient Re-evaluated prior to induction Oxygen Delivery Method: Circle system utilized Preoxygenation: Pre-oxygenation with 100% oxygen Induction Type: IV induction Ventilation: Mask ventilation without difficulty Laryngoscope Size: Mac and 3 Grade View: Grade I Tube type: Oral Tube size: 7.0 mm Number of attempts: 1 Airway Equipment and Method: Stylet and Oral airway Placement Confirmation: ETT inserted through vocal cords under direct vision,  positive ETCO2 and breath sounds checked- equal and bilateral Secured at: 21 cm Tube secured with: Tape Dental Injury: Teeth and Oropharynx as per pre-operative assessment

## 2020-01-01 NOTE — Transfer of Care (Signed)
Immediate Anesthesia Transfer of Care Note  Patient: Danielle Bishop  Procedure(s) Performed: BILATERAL NIPPLE SPARING MASTECTOMY WITH LEFT SENTINEL LYMPH NODE BIOPSY (Bilateral Breast) BREAST RECONSTRUCTION WITH PLACEMENT OF TISSUE EXPANDER AND FLEX HD (ACELLULAR HYDRATED DERMIS) (Bilateral Breast)  Patient Location: PACU  Anesthesia Type:GA combined with regional for post-op pain  Level of Consciousness: awake and drowsy  Airway & Oxygen Therapy: Patient Spontanous Breathing and Patient connected to face mask oxygen  Post-op Assessment: Report given to RN and Post -op Vital signs reviewed and stable  Post vital signs: Reviewed and stable  Last Vitals:  Vitals Value Taken Time  BP    Temp    Pulse 97 01/01/20 1439  Resp 15 01/01/20 1439  SpO2 100 % 01/01/20 1439  Vitals shown include unvalidated device data.  Last Pain:  Vitals:   01/01/20 0756  TempSrc:   PainSc: 0-No pain         Complications: No complications documented.

## 2020-01-01 NOTE — Op Note (Signed)
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  Indocyanine green angiography for assessment of bilateral mastectomy flaps 2.  Bilateral subpectoral breast reconstruction with tissue expanders and acellular dermal matrix 3.  Incisional wound VAC application to bilateral chest  SURGEON: Talmadge Coventry, MD  ASSISTANT: Verdie Shire, PA The advanced practice practitioner (APP) assisted throughout the case.  The APP was essential in retraction and counter traction when needed to make the case progress smoothly.  This retraction and assistance made it possible to see the tissue plans for the procedure.  The assistance was needed for blood control, tissue re-approximation and assisted with closure of the incision site.  ANESTHESIA:  General.   COMPLICATIONS: None.   INDICATIONS FOR PROCEDURE:  The patient, Danielle Bishop is a 49 y.o. 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 Risks, benefits and alternatives were fully discussed. Specific risks including but not limited to bleeding, infection, hematoma, seroma, scarring, pain, contracture, asymmetry, wound healing problems, and need for further surgery were all discussed. The patient did have an ample opportunity to have questions answered to satisfaction.   DESCRIPTION OF PROCEDURE:  The patient was taken to the operating room. SCDs were placed and Ancef antibiotics were given.  General anesthesia was administered.  The patient's operative site was prepped and draped in a sterile fashion. A time out was performed and all information was confirmed to be correct.  Dr. Barry Dienes performed her portion of the case which involved a bilateral skin  sparing mastectomy and sentinel lymph node biopsy in the left axilla.  Patient was then turned over to me for reconstruction.  I started by assessing the mastectomy flaps which clinically looked quite good.  Indocyanine green was then administered in the spy system was used to evaluate mastectomy flap perfusion which looked great.  I then proceeded to ensure hemostasis on both sides.  A small rim of the few millimeters of skin was excised on both sides with a 15 blade extending into a small ellipse to remove the dogears.  15 [place] JP drains were placed on both sides and secured with a 3-0 nylon suture.  I then elevated the pectoralis major muscle using mostly blunt dissection.  Cautery was used to extend this medially and inferiorly to take the pocket down to the inframammary fold.  This was done on both sides.  Both pockets were irrigated with triple antibiotic solution.  I chose a Mentor smooth medium height 275 cc tissue expander.  The serial number for the left is [id number].  The serial number for the right is [id number].  A piece of Flex HD was also brought onto the field and soaked in injectable saline solution to rinse it for least 5 minutes.  It was then cut to fashion a lateral sling to cover the lateral portion of the tissue expander.  This was secured along the lateral chest wall with interrupted 3-0 PDS sutures.  The tissue expanders were then placed beneath the pectoralis major and secured laterally with two 3-0 PDS sutures through the suture tabs.  The tissue expanders had been deflated of all air and were each filled with 50 cc of injectable saline.  The rest of the acellular dermal matrix was tucked up  under the pectoralis major muscle and was secured in that location with 3-0 PDS sutures.  This gave her a nice on table result with minimal tension on the skin.  The skin was then closed with interrupted buried 3-0 PDS sutures and a running 3 OV lock suture.  A Praveena Bella incisional wound VAC  was then applied and a good seal was obtained.  The chest was then wrapped in an Ace wrap.  Drains were placed to bulb suction.  I did perform the indocyanine green angiography again at the end of the case after closure and all the flaps still looks great.  The patient tolerated the procedure well.  There were no complications. The patient was allowed to wake from anesthesia, extubated and taken to the recovery room in satisfactory condition.

## 2020-01-01 NOTE — Brief Op Note (Signed)
01/01/2020  2:24 PM  PATIENT:  Danielle Bishop  49 y.o. female  PRE-OPERATIVE DIAGNOSIS:  LEFT BREAST CANCER, RIGHT ABNORMAL MRI  POST-OPERATIVE DIAGNOSIS:  LEFT BREAST CANCER, RIGHT ABNORMAL MRI  PROCEDURE:  Procedure(s) with comments: BILATERAL NIPPLE SPARING MASTECTOMY WITH LEFT SENTINEL LYMPH NODE BIOPSY (Bilateral) - PEC BLOCK BREAST RECONSTRUCTION WITH PLACEMENT OF TISSUE EXPANDER AND FLEX HD (ACELLULAR HYDRATED DERMIS) (Bilateral)  SURGEON:  Surgeon(s) and Role: Panel 1:    Stark Klein, MD - Primary Panel 2:    * [name], MD - Primary  PHYSICIAN ASSISTANT: Software engineer, PA  ASSISTANTS: none   ANESTHESIA:   general  EBL:  10 mL   BLOOD ADMINISTERED:none  DRAINS: (2) Jackson-Pratt drain(s) with closed bulb suction in the chest   LOCAL MEDICATIONS USED:  NONE  SPECIMEN:  No Specimen  DISPOSITION OF SPECIMEN:  PATHOLOGY  COUNTS:  YES  TOURNIQUET:  * No tourniquets in log *  DICTATION: .Dragon Dictation  PLAN OF CARE: Admit to inpatient   PATIENT DISPOSITION:  PACU - hemodynamically stable.   Delay start of Pharmacological VTE agent (>24hrs) due to surgical blood loss or risk of bleeding: not applicable

## 2020-01-02 ENCOUNTER — Encounter (HOSPITAL_BASED_OUTPATIENT_CLINIC_OR_DEPARTMENT_OTHER): Payer: Self-pay | Admitting: General Surgery

## 2020-01-02 DIAGNOSIS — C50011 Malignant neoplasm of nipple and areola, right female breast: Secondary | ICD-10-CM | POA: Diagnosis not present

## 2020-01-02 NOTE — Discharge Summary (Signed)
Physician Discharge Summary  Patient ID: Danielle Bishop MRN: [id number] DOB/AGE: 11-04-1970 49 y.o.  Admit date: 01/01/2020 Discharge date: 01/02/2020  Admission Diagnoses:Breast Cancer  Discharge Diagnoses:  Active Problems:   Breast cancer [facility])   Discharged Condition: good  Hospital Course: No overnight issues. Patient is resting comfortably with husband at bedside and in great spirits. Reports pain is well controlled. Denies F, CP,SOB, N/V.  Wound VACs in place with no appreciable leaks. No signs of redness, infection, drainage around wound VAC. No signs of hemartoma/seroma.  Consults: None  Significant Diagnostic Studies: none  Treatments: surgery: Mastectomy with reconstruction  Discharge Exam: Blood pressure 95/69, pulse 80, temperature 99.4 F (37.4 C), resp. rate 18, height 5\' 3"  (1.6 m), weight 51.6 kg, last menstrual period 12/24/2019, SpO2 99 %. General appearance: alert, cooperative, no distress and very pleasant Head: Normocephalic, without obvious abnormality, atraumatic Eyes: EOMs intact Resp: nonlabored Chest wall: symmetric rise and fall. Wound VAC in place with no appreciable leaks. No signs of redness, infection, drainage around wound VAC. No signs of seroma/hematoma  Disposition: Discharge disposition: 01-Home or Self Care       Discharge Instructions    Call MD for:  difficulty breathing, headache or visual disturbances   Complete by: As directed    Call MD for:  extreme fatigue   Complete by: As directed    Call MD for:  hives   Complete by: As directed    Call MD for:  persistant dizziness or light-headedness   Complete by: As directed    Call MD for:  persistant nausea and vomiting   Complete by: As directed    Call MD for:  redness, tenderness, or signs of infection (pain, swelling, redness, odor or green/yellow discharge around incision site)   Complete by: As directed    Call MD for:  severe uncontrolled pain   Complete by: As directed     Call MD for:  temperature >100.4   Complete by: As directed    Diet - low sodium heart healthy   Complete by: As directed    Diet - low sodium heart healthy   Complete by: As directed    Increase activity slowly   Complete by: As directed    Increase activity slowly   Complete by: As directed      Allergies as of 01/02/2020   No Known Allergies     Medication List    TAKE these medications   ALPRAZolam 0.25 MG tablet Commonly known as: XANAX Take 1 tablet (0.25 mg total) by mouth 2 (two) times daily as needed for anxiety.   ondansetron 4 MG tablet Commonly known as: Zofran Take 1 tablet (4 mg total) by mouth every 8 (eight) hours as needed for nausea or vomiting.   sertraline 50 MG tablet Commonly known as: ZOLOFT Take 1 tablet (50 mg total) by mouth daily.       Follow-up Information    Stark Klein, MD In 2 weeks.   Specialty: General Surgery Contact information: [address] [place] [address] [phone]              Dr. Claudia Desanctis [address] [place], [place] [address]  Signed: Threasa Heads 01/02/2020, 7:29 AM

## 2020-01-02 NOTE — Discharge Instructions (Signed)
Activity As tolerated: NO showers while wound VAC is in place No heavy activities  Diet: Regular  Wound Care: Keep dressing clean & dry.  Keep wrap applied with compression as much as possible.  Can change ACE wrap as needed.    Can change dressing if needed but make sure to reapply wrap.  Call doctor if any unusual problems occur such as pain, excessive bleeding, unrelieved nausea/vomiting, fever &/or chills.  If the wound VAC looses it's seal it will beep.  If this happens we will see you in our office that day to address it, or first thing the following morning if it occurs overnight.  Empty and record JP drain output and bring that information to your follow up appointments.  Follow-up appointment: Scheduled for next week.   About my Jackson-Pratt Bulb Drain  What is a Jackson-Pratt bulb? A Jackson-Pratt is a soft, round device used to collect drainage. It is connected to a long, thin drainage catheter, which is held in place by one or two small stiches near your surgical incision site. When the bulb is squeezed, it forms a vacuum, forcing the drainage to empty into the bulb.  Emptying the Jackson-Pratt bulb- To empty the bulb: 1. Release the plug on the top of the bulb. 2. Pour the bulb's contents into a measuring container which your nurse will provide. 3. Record the time emptied and amount of drainage. Empty the drain(s) as often as your     doctor or nurse recommends.  Date                  Time                    Amount (Drain 1)                 Amount (Drain  2)  _____________________________________________________________________  _____________________________________________________________________  _____________________________________________________________________  _____________________________________________________________________  _____________________________________________________________________  _____________________________________________________________________  _____________________________________________________________________  _____________________________________________________________________  Squeezing the Jackson-Pratt Bulb- To squeeze the bulb: 1. Make sure the plug at the top of the bulb is open. 2. Squeeze the bulb tightly in your fist. You will hear air squeezing from the bulb. 3. Replace the plug while the bulb is squeezed. 4. Use a safety pin to attach the bulb to your clothing. This will keep the catheter from     pulling at the bulb insertion site.  When to call your doctor- Call your doctor if:  Drain site becomes red, swollen or hot.  You have a fever greater than 101 degrees F.  There is oozing at the drain site.  Drain falls out (apply a guaze bandage over the drain hole and secure it with tape).  Drainage increases daily not related to activity patterns. (You will usually have more drainage when you are active than when you are resting.)  Drainage has a bad odor.  Information for Discharge Teaching: EXPAREL (bupivacaine liposome injectable suspension)   Your surgeon or anesthesiologist gave you EXPAREL(bupivacaine) to help control your pain after surgery.   EXPAREL is a local anesthetic that provides pain relief by numbing the tissue around the surgical site.  EXPAREL is designed to release pain medication over time and can control pain for up to 72 hours.  Depending on how you respond to EXPAREL, you may require less pain medication during your recovery.  Possible side  effects:  Temporary loss of sensation or ability to move in the area where bupivacaine was injected.  Nausea, vomiting, constipation  Rarely, numbness and tingling in your mouth or lips, lightheadedness, or anxiety may occur.  Call your doctor right away if you think you may be experiencing any of these sensations, or if you have other questions regarding possible side effects.  Follow all other discharge instructions given to you by your surgeon or nurse. Eat a healthy diet and drink plenty of water or other fluids.  If you return to the hospital for any reason within 96 hours following the administration of EXPAREL, it is important for health care providers to know that you have received this anesthetic. A teal colored band has been placed on your arm with the date, time and amount of EXPAREL you have received in order to alert and inform your health care providers. Please leave this armband in place for the full 96 hours following administration, and then you may remove the band.

## 2020-01-06 LAB — SURGICAL PATHOLOGY

## 2020-01-08 ENCOUNTER — Encounter: Payer: Self-pay | Admitting: Plastic Surgery

## 2020-01-08 ENCOUNTER — Ambulatory Visit (INDEPENDENT_AMBULATORY_CARE_PROVIDER_SITE_OTHER): Payer: 59 | Admitting: Plastic Surgery

## 2020-01-08 ENCOUNTER — Other Ambulatory Visit: Payer: Self-pay

## 2020-01-08 VITALS — BP 104/46 | HR 92 | Temp 98.5°F

## 2020-01-08 DIAGNOSIS — D0512 Intraductal carcinoma in situ of left breast: Secondary | ICD-10-CM

## 2020-01-08 NOTE — Progress Notes (Signed)
Patient is here status post bilateral breast reconstruction with submuscular tissue expanders.  She overall feels good.  She had the [facility] on and this has maintained a good seal.  Her drains have put out 10 to 15 cc/day over the past few days and were therefore removed today.  On exam everything looks good.  Her incisions are healing fine.  I do not see any evidence of skin complications.  There is no subcutaneous swelling or fluid collections.  I will plan to see her back next week to start her fills.  I believe she had a positive anterior margin on her pathology but will have to confirm this.  If she does end up needing radiation I would like to expand her quickly to try to get her switched out before radiation starts.

## 2020-01-12 NOTE — Progress Notes (Signed)
[facility]  [address] [place], [place] [address] Phone: [phone]  Fax: [phone]   Clinic Day:  01/13/2020  Referring physician: Juline Patch, MD  Chief Complaint: Danielle Bishop is a 49 y.o. 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  HPI: The patient was last seen in the medical oncology clinic on 12/02/2019. At that time, she was doing well.  She was cutting back on smoking.  She had decided to pursue bilateral mastectomy.  She underwent bilateral nipple sparing mastectomy and reconstruction on 01/01/2020 by Dr. Barry Dienes and Dr. Claudia Desanctis. Pathology showed a 6.5 cm area of grade II DCIS, focally involving the anterior margin and <1 mm from the posterior margin.  One sentinel lymph node was negative.  ER was 95%, PR 100%.  Pathologic stage was pTis (DCIS) pN0.  During the interim, she has been "alright".  She has no more pain from her surgery and notes that everything went as expected. She got her drains out last week. She denies any new symptoms or complaints at this time.   Past Medical History:  Diagnosis Date  . Anxiety   . Breast cancer, left [facility])    dx 09/ 2020 by bx with atypical ductal hyperplasia/ papillnoma;   s/p  lefft breast excsional bx 05-21-2019 ,  dx DCIS  . Family history of breast cancer   . Family history of cancer of tongue   . Family history of skin cancer     Past Surgical History:  Procedure Laterality Date  . BREAST RECONSTRUCTION WITH PLACEMENT OF TISSUE EXPANDER AND FLEX HD (ACELLULAR HYDRATED DERMIS) Bilateral 01/01/2020   Procedure: BREAST RECONSTRUCTION WITH PLACEMENT OF TISSUE EXPANDER AND FLEX HD (ACELLULAR HYDRATED DERMIS);  Surgeon: [name], MD;  Location: [place];  Service: Plastics;  Laterality: Bilateral;  . MASTECTOMY W/ SENTINEL NODE BIOPSY Bilateral 01/01/2020   Procedure: BILATERAL NIPPLE SPARING  MASTECTOMY WITH LEFT SENTINEL LYMPH NODE BIOPSY;  Surgeon: Stark Klein, MD;  Location: [place];  Service: General;  Laterality: Bilateral;  PEC BLOCK  . RADIOACTIVE SEED GUIDED EXCISIONAL BREAST BIOPSY Left 05/21/2019   Procedure: LEFT RADIOACTIVE SEED GUIDED EXCISIONAL BREAST BIOPSY;  Surgeon: Stark Klein, MD;  Location: [place];  Service: General;  Laterality: Left;  . RE-EXCISION OF BREAST LUMPECTOMY Left 06/19/2019   Procedure: RE-EXCISION OF LEFT BREAST LUMPECTOMY;  Surgeon: Stark Klein, MD;  Location: [place];  Service: General;  Laterality: Left;    Family History  Problem Relation Age of Onset  . Stroke Mother   . Breast cancer Maternal Grandmother 57  . Skin cancer Paternal Grandmother   . Skin cancer Paternal Grandfather   . Breast cancer Other   . Tongue cancer Other   . Cancer Other        unknown type  . Breast cancer Other 89  . Breast cancer Other     Social History:  reports that she has been smoking cigarettes. She has a 10.00 pack-year smoking history. She has never used smokeless tobacco. She reports current alcohol use. She reports that she does not use drugs. She smoked 1/2 pack/day x 20 years. She is now smoking 2 cigarettes per day and some days she does not smoke at all.She occasionally drinks alcohol. Sge denies any exposure to radiation or toxins. 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 The patient is alone today.  Allergies: No Known Allergies  Current Medications: Current Outpatient Medications  Medication Sig Dispense Refill  . ALPRAZolam (XANAX) 0.25 MG tablet Take 1 tablet (0.25 mg total) by mouth 2 (two) times daily as needed for anxiety. 20 tablet 1  . HYDROcodone-acetaminophen (NORCO) 10-325 MG tablet Take 1 tablet by mouth every 6 (six) hours as needed.    . sertraline (ZOLOFT) 50 MG tablet Take 1 tablet (50 mg total) by mouth daily. 30 tablet 3  . ondansetron  (ZOFRAN) 4 MG tablet Take 1 tablet (4 mg total) by mouth every 8 (eight) hours as needed for nausea or vomiting. (Patient not taking: Reported on 01/13/2020) 20 tablet 0  . sulfamethoxazole-trimethoprim (BACTRIM) 400-80 MG tablet Take 1 tablet by mouth 2 (two) times daily. (Patient not taking: Reported on 01/13/2020)     No current facility-administered medications for this visit.    Review of Systems  Constitutional: Negative for chills, diaphoresis, fever, malaise/fatigue and weight loss (up 4 lbs).       Doing well.  HENT: Negative for congestion, ear discharge, ear pain, hearing loss, nosebleeds, sinus pain, sore throat and tinnitus.   Eyes: Negative for blurred vision.  Respiratory: Negative for cough, hemoptysis, sputum production and shortness of breath.   Cardiovascular: Negative for chest pain, palpitations and leg swelling.  Gastrointestinal: Negative for abdominal pain, blood in stool, constipation, diarrhea, heartburn, melena, nausea and vomiting.  Genitourinary: Negative for dysuria, flank pain, frequency, hematuria and urgency.  Musculoskeletal: Negative for back pain, joint pain, myalgias and neck pain.  Skin: Negative for itching and rash.  Neurological: Negative for dizziness, tingling, tremors, sensory change, weakness and headaches.  Endo/Heme/Allergies: Does not bruise/bleed easily.  Psychiatric/Behavioral: Negative for depression and memory loss. The patient is not nervous/anxious and does not have insomnia.   All other systems reviewed and are negative.  Performance status (ECOG): 0  Vitals Blood pressure (!) 115/90, pulse 70, temperature 98.6 F (37 C), temperature source Tympanic, weight 116 lb 8.2 oz (52.9 kg), last menstrual period 12/24/2019, SpO2 100 %.   Physical Exam Vitals and nursing note reviewed.  Constitutional:      General: She is not in acute distress.    Appearance: Normal appearance.     Interventions: Face mask in place.  HENT:     Head:  Normocephalic and atraumatic.     Comments: Long dark hair.    Mouth/Throat:     Mouth: Mucous membranes are moist.     Pharynx: Oropharynx is clear.  Eyes:     General: No scleral icterus.    Extraocular Movements: Extraocular movements intact.     Conjunctiva/sclera: Conjunctivae normal.     Pupils: Pupils are equal, round, and reactive to light.  Cardiovascular:     Rate and Rhythm: Normal rate and regular rhythm.     Pulses: Normal pulses.     Heart sounds: Normal heart sounds. No murmur heard.   Pulmonary:     Effort: Pulmonary effort is normal. No respiratory distress.     Breath sounds: Normal breath sounds. No wheezing or rales.  Chest:     Chest wall: No tenderness.     Comments: Ace bandage in place.  S/p bilateral mastectomy with reconstruction. Abdominal:     General: Bowel sounds are normal. There is no distension.     Palpations: Abdomen is soft. There is no mass.     Tenderness: There is no abdominal tenderness. There is no guarding.  Musculoskeletal:        General: No swelling or tenderness. Normal range of motion.     Cervical back: Normal range of motion and neck supple.  Lymphadenopathy:     Head:     Right side of head: No preauricular, posterior auricular or occipital adenopathy.     Left side of head: No preauricular, posterior auricular or occipital adenopathy.     Cervical: No cervical adenopathy.     Upper Body:     Right upper body: No supraclavicular or axillary adenopathy.     Left upper body: No supraclavicular or axillary adenopathy.     Lower Body: No right inguinal adenopathy. No left inguinal adenopathy.  Skin:    General: Skin is warm and dry.  Neurological:     Mental Status: She is alert and oriented to person, place, and time. Mental status is at baseline.  Psychiatric:        Mood and Affect: Mood normal.        Behavior: Behavior normal.        Thought Content: Thought content normal.        Judgment: Judgment normal.    No  visits with results within 3 Day(s) from this visit.  Latest known visit with results is:  Admission on 06/19/2019, Discharged on 06/19/2019  Component Date Value Ref Range Status  . Preg Test, Ur 06/19/2019 NEGATIVE  NEGATIVE Final   Comment:        THE SENSITIVITY OF THIS METHODOLOGY IS >24 mIU/mL   . SURGICAL PATHOLOGY 06/19/2019    Final-Edited                   Value:SURGICAL PATHOLOGY CASE: [id number] PATIENT: Tylan Loudermilk Surgical Pathology Report  Clinical History: Left breast cancer (cm)  FINAL MICROSCOPIC DIAGNOSIS:  A. BREAST, LEFT POSTERIOR MARGIN, RE-EXCISION: - Focus of residual ductal carcinoma in situ. - Atypical lobular hyperplasia/adenosis. - Please see comment.  B. BREAST, LEFT INFERIOR MARGIN, RE-EXCISION: - Residual ductal carcinoma in situ. - Please see comment.  COMMENT:  A. The focus of residual DCIS is present at the cauterized black ink. CK5/6 is negative in DCIS.              E-cadherin demonstrates weak staining in atypical lobular hyperplasia.  P63, Calponin and SMM-1 demonstrate the presence of myoepithelium throughout.  B.  Focally, residual DCIS is present at the cauterized black ink. CK5/6 is negative in DCIS.  Intradepartmental consultation (Dr. Tresa Moore).  GROSS DESCRIPTION:  A. Received fresh and placed in formalin at 12 PM is a 3 x 2.6 x 1.5 cm portion of tan-yellow soft tiss                         ue, received entirely inked black. Sectioned and entirely submitted in 5 cassettes.  B.  Received fresh and placed in formalin at 12 PM is a 3.1 x 1.5 x 1 cm portion of tan-yellow soft tissue, received entirely inked blue. Sectioned and entirely submitted in 5 cassettes.  (AK 06/19/2019)  Final Diagnosis performed by Gillie Manners, MD.   Electronically signed 06/24/2019 Technical and / or Professional components performed at [facility]. [facility], [place] [address] [place], [place] [address].  Immunohistochemistry Technical  component (if applicable) was performed at [facility]. [address], [place], [place], [place] [address].   IMMUNOHISTOCHEMISTRY DISCLAIMER (if applicable): Some of these immunohistochemical stains may have been developed and  the performance characteristics determine by [facility]. Some may not have been cleared or approved by the U.S. Food and Drug Administration. The FDA has determined that such clear                         ance or approval is not necessary. This test is used for clinical purposes. It should not be regarded as investigational or for research. This laboratory is certified under the [place] (CLIA-88) as qualified to perform high complexity clinical laboratory testing.  The controls stained appropriately.    Assessment:  Danielle Bishop is a 49 y.o. female  with left breast DCISs/p bilateral nipple sparing mastectomy and reconstruction on 01/01/2020.  Pathology revealed a 6.5 cm area of grade II DCIS, focally involving the anterior margin and <1 mm from the posterior margin.  One sentinel lymph node was negative.  DCIS was ER+ (95%) and PR+ (100%).  Pathologic stage was pTis (DCIS) pN0.  Right breast pathology was benign.  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  Bilateral mammogram with left axillary ultrasoundon 02/14/2019 revealed duct ectasia in the 6-7 o'clock location of the LEFT breast without identifiable intraductal mass. There was a solid 0.7 x 0.5 x 0.7 cm mass in the 7 o'clock location 2 cm from the nipple in the LEFT breast. There was an enlarged LEFT axillary lymph node. There was a single duct spontaneous LEFT nipple discharge.  Bilateral breast MRI on 07/16/2019 revealed postoperative seroma in the central inferior left breast at the site of patient's lumpectomy for  DCIS. Non-mass enhancement was seen surrounding the lumpectomy site involving the entire inferior left breast, spanning 4.0 x 5.8 x 4.3 cm. There was suspicious linear enhancement extending from the postoperative seroma into the nipple. There was indeterminate patchy non-mass enhancement extending into the upper inner quadrant(UIQ)of the left breast (1.3 x 1.2 cm). There were two indeterminate enhancing masses in the upper inner and lower outer right breast. There was no suspicious lymphadenopathy.  Left breast biopsy on02/12/2021revealedfibrocystic changes in the upper inner quadrant(UIQ)and intermediate grade DCISin thelower outer quadrant (LOQ).DCIS wasER+ (95%)and PR+ (100%).  Right breast biopsyon 02/15/2021revealedatypical lobular hyperplasia, findings suggestive of lipoma,and fibrocystic changes in lower outer quadrant(LOQ).  She has a family historyof breast cancer (maternal grandmother age 66).  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  Patient received the Moderna COVID-19 vaccine on 08/16/2019 and 09/18/2019.   Symptomatically, she is doing well status post surgery.  Plan: 1. Left breast DCIS Initial pathology revealedgrade II DCIS with focally positive margin. Re-excisionpathology revealed persistent + margins (posterior and inferior). She is s/p bilateral mastectomy and reconstruction on 01/01/2020.  Pathology revealed a 6.5 cm area of grade 2 DCIS focally involving the anterior margin and < 1 mm from the posterior margin.  Discussed likely plan for radiation given positive margin.  Discuss rationale for endocrine therapy for stage 0 breast cancer:   NSABP B-24:  1800 patients randomized between tamoxifen vs placebo in patients s/p BCT and radiation.      At 13.6 years, patients who received  tamoxifen had a 3.4% reduction on ipsilateral recurrence and a 3.2% reduction in contralateral breast cancer.      10 year risk of invasive cancer in the ipsilateral breast was 4.6% in  the tamoxifen group vs 7.3% placebo group.    10 year risk of 5.6% non-invasive ipsilateral breast cancer was 5.6% in the tamoxifen group vs 7.2% in the placebo group.      10 year risk in the contralateral breast of invasive and non-invasive breast cancer was 4.7% in the tamoxifen group vs 6.9% in the placebo group.    As patient is s/p bilateral mastectomy, discuss limited benefit of adjuvant endocrine therapy.  Present patient at tumor board. 2. Family history of breast cancer Patient 72 with family history of breast cancer in a maternal grandmother at age 34. [employer] results revealed a variant of uncertain significance.   ATM c.7816A>G (p.lle2606Val) and gain (exons 62-63). 3.   Tumor board on 01/20/2020. 4.   MD to call patient after tumor board on 01/20/2020. 5.   RTC in 3 months for MD assessment.  I discussed the assessment and treatment plan with the patient.  The patient was provided an opportunity to ask questions and all were answered.  The patient agreed with the plan and demonstrated an understanding of the instructions.  The patient was advised to call back if the symptoms worsen or if the condition fails to improve as anticipated.   Lequita Asal, MD, PhD    01/13/2020, 10:51 AM  I, Mirian Mo Tufford, am acting as Education administrator for [employer]. Mike Gip, MD, PhD.  I, Makinzie Considine C. Mike Gip, MD, have reviewed the above documentation for accuracy and completeness, and I agree with the above.

## 2020-01-13 ENCOUNTER — Encounter: Payer: Self-pay | Admitting: Hematology and Oncology

## 2020-01-13 ENCOUNTER — Inpatient Hospital Stay: Payer: 59 | Attending: Hematology and Oncology | Admitting: Hematology and Oncology

## 2020-01-13 ENCOUNTER — Other Ambulatory Visit: Payer: Self-pay

## 2020-01-13 VITALS — BP 115/90 | HR 70 | Temp 98.6°F | Wt 116.5 lb

## 2020-01-13 DIAGNOSIS — D0512 Intraductal carcinoma in situ of left breast: Secondary | ICD-10-CM

## 2020-01-13 DIAGNOSIS — Z803 Family history of malignant neoplasm of breast: Secondary | ICD-10-CM | POA: Insufficient documentation

## 2020-01-13 DIAGNOSIS — F17210 Nicotine dependence, cigarettes, uncomplicated: Secondary | ICD-10-CM | POA: Diagnosis not present

## 2020-01-13 DIAGNOSIS — Z9013 Acquired absence of bilateral breasts and nipples: Secondary | ICD-10-CM | POA: Diagnosis not present

## 2020-01-13 DIAGNOSIS — Z808 Family history of malignant neoplasm of other organs or systems: Secondary | ICD-10-CM | POA: Insufficient documentation

## 2020-01-13 DIAGNOSIS — Z79899 Other long term (current) drug therapy: Secondary | ICD-10-CM | POA: Insufficient documentation

## 2020-01-13 DIAGNOSIS — F419 Anxiety disorder, unspecified: Secondary | ICD-10-CM | POA: Insufficient documentation

## 2020-01-13 DIAGNOSIS — Z170 Estrogen receptor positive status [ER+]: Secondary | ICD-10-CM | POA: Insufficient documentation

## 2020-01-13 NOTE — Progress Notes (Signed)
No new changes noted today 

## 2020-01-15 ENCOUNTER — Encounter: Payer: Self-pay | Admitting: Surgical

## 2020-01-15 ENCOUNTER — Other Ambulatory Visit: Payer: Self-pay

## 2020-01-15 ENCOUNTER — Ambulatory Visit (INDEPENDENT_AMBULATORY_CARE_PROVIDER_SITE_OTHER): Payer: 59 | Admitting: Surgical

## 2020-01-15 VITALS — BP 111/72 | HR 67 | Temp 99.0°F

## 2020-01-15 DIAGNOSIS — F17219 Nicotine dependence, cigarettes, with unspecified nicotine-induced disorders: Secondary | ICD-10-CM

## 2020-01-15 DIAGNOSIS — D0512 Intraductal carcinoma in situ of left breast: Secondary | ICD-10-CM

## 2020-01-15 NOTE — Progress Notes (Signed)
Patient is a 49 year old female here for follow-up after bilateral breast reconstruction with placement of submuscular tissue expanders with Dr. Claudia Desanctis on 01/01/2020.  Patient is doing really well.  She has no pain.  Chaperone present on exam On exam bilateral breast incisions are healing well.  No erythema.  No cellulitis.  No swelling.  No evidence of any skin complications or concerns.  We placed injectable saline in the Expander using a sterile technique: Right: 50 cc for a total of 100 / 275 cc Left: 50 cc for a total of 100 / 275 cc  Patient is doing well.  No sign of infection, seroma, hematoma.  Has upcoming appointment with tumor board/oncology to determine if radiation will be necessary.  Patient also has follow-up with general surgery next week.  We will see her next week to try and do an additional fill.  I discussed with patient that if she has any muscle cramps to call and I can prescribe Valium.  Call with questions or concerns.

## 2020-01-20 ENCOUNTER — Inpatient Hospital Stay: Payer: 59

## 2020-01-21 ENCOUNTER — Encounter: Payer: Self-pay | Admitting: Hematology and Oncology

## 2020-01-22 ENCOUNTER — Encounter: Payer: Self-pay | Admitting: Surgical

## 2020-01-22 ENCOUNTER — Other Ambulatory Visit: Payer: Self-pay

## 2020-01-22 ENCOUNTER — Ambulatory Visit (INDEPENDENT_AMBULATORY_CARE_PROVIDER_SITE_OTHER): Payer: 59 | Admitting: Surgical

## 2020-01-22 VITALS — BP 109/73 | HR 83 | Temp 98.5°F

## 2020-01-22 DIAGNOSIS — D0512 Intraductal carcinoma in situ of left breast: Secondary | ICD-10-CM

## 2020-01-22 DIAGNOSIS — F17219 Nicotine dependence, cigarettes, with unspecified nicotine-induced disorders: Secondary | ICD-10-CM

## 2020-01-22 NOTE — Progress Notes (Signed)
Patient is a 49 year old female here for follow-up after bilateral breast reconstruction placement of submuscular tissue expanders with Dr. Claudia Desanctis on 01/01/2020.  She is doing well.  She reports that she tolerated last fill fine without any issues.  She has not heard back from oncology yet so she is unsure if she will need radiation or chemotherapy at this time.  She is hopeful she will hear back soon.    On exam incisions are C/D/I, no skin compromise noted.  Everything appears to be healing well without any issue.  We placed injectable saline in the Expander using a sterile technique: Right: 50 cc for a total of 150/275 cc Left: 50 cc for a total of 150/275 cc  Patient is content with the current size of her expanders, we did discuss that if this is the size she is happy with we would likely need to fill at least 1 more time as the implant may appear smaller than the expander due to the bulkiness of the expander.  I recommend she come back in 1 week for an additional fill.  If she finds out in the next week that she does not need any radiation or chemo, we can wait 2 weeks to expand to give her some more time to heal/stretch the skin.  Call with any questions or concerns

## 2020-01-23 ENCOUNTER — Telehealth: Payer: Self-pay

## 2020-01-23 ENCOUNTER — Telehealth: Payer: Self-pay | Admitting: *Deleted

## 2020-01-23 ENCOUNTER — Other Ambulatory Visit: Payer: Self-pay | Admitting: Hematology and Oncology

## 2020-01-23 DIAGNOSIS — D0512 Intraductal carcinoma in situ of left breast: Secondary | ICD-10-CM

## 2020-01-23 NOTE — Telephone Encounter (Signed)
Spoke with the patient to what i could try to help her with. She states that Dr Mike Gip has called her and explained things to her.

## 2020-01-23 NOTE — Telephone Encounter (Signed)
Patient called stating that she is still awaiting a call from Dr Humberto Seals regarding treatment after discussing her at Tumor Board. Please return her call ASAP

## 2020-01-24 NOTE — Telephone Encounter (Signed)
Re:  Tumor board discussions  I contacted the patient yesterday afternoon and discussed recommendations from tumor board.  Given her positive margins, radiation was recommended.  A consult for radiation oncology was placed.  Recommendations also included consideration of endocrine therapy for DCIS.  Previously we discussed no endocrine therapy.  I reviewed rationale for endocrine therapy for stage 0 breast cancer:  NSABP B-24:  1800 patients randomized between tamoxifen vs placebo in patients s/p BCT (patient had bilateral mastectomy with + margin) and radiation.    At 13.6 years, patients who received tamoxifen had a 3.4% reduction in ipsilateral recurrence and a 3.2% reduction in contralateral breast cancer.    10 year risk of invasive cancer in the ipsilateral breast was 4.6% in the tamoxifen group vs 7.3% placebo group.  10 year risk of 5.6% non-invasive ipsilateral breast cancer was 5.6% in the tamoxifen group vs 7.2% in the placebo group.    I discussed follow-up after radiation to discuss further.   Lequita Asal, MD

## 2020-01-30 ENCOUNTER — Ambulatory Visit (INDEPENDENT_AMBULATORY_CARE_PROVIDER_SITE_OTHER): Payer: 59 | Admitting: Surgical

## 2020-01-30 ENCOUNTER — Other Ambulatory Visit: Payer: Self-pay

## 2020-01-30 ENCOUNTER — Encounter: Payer: Self-pay | Admitting: Surgical

## 2020-01-30 VITALS — BP 101/65 | HR 89 | Temp 99.0°F

## 2020-01-30 DIAGNOSIS — D0512 Intraductal carcinoma in situ of left breast: Secondary | ICD-10-CM

## 2020-01-30 DIAGNOSIS — F17219 Nicotine dependence, cigarettes, with unspecified nicotine-induced disorders: Secondary | ICD-10-CM

## 2020-01-30 NOTE — Progress Notes (Signed)
Patient is a 50 year old female here for follow-up after bilateral breast reconstruction, placement of submuscular tissue expanders with Dr. Claudia Desanctis on 01/01/2020.  Per EMR review, oncology noted that patient will need radiation therapy due to positive margins after nipple sparing mastectomy.  Patient is scheduled to meet with radiation oncology next week on 02/05/2020.  She is going to have only left-sided radiation.  Patient reports that she is doing well.  She has noticed some muscle cramping after expansion.  She reports it is tolerable.  She has previously taken a Xanax which has been helpful.  She is pleased with how things are going overall.  On exam bilateral breast incisions are healing well, skin is all viable and well-healed.  Breast expanders are symmetric.  No erythema or cellulitic changes noted.  No swelling noted.   We had a thorough discussion about radiation and reconstruction.  We discussed that it would be up to radiation oncology's timeline for the urgency for radiation as to whether we would be able to exchange prior to radiation or after.  Patient is scheduled to meet with them on 02/05/2020 to further discuss.  I have discussed this with Dr. Claudia Desanctis who is going to reach out to radiation oncology to further discuss as well.  Patient is aware that if radiation is necessary prior to exchange that we would have to wait 6 months after radiation to exchange.  Patient is content with the current size of her expanders and would like to move forward with exchange if possible.  We would likely schedule this for the next few weeks if we are able pending any radiation oncology updates.  Patient is going to update [place] after her visit with radiation oncology.  Recommend calling with any questions or concerns, follow-up will be determined after update.  We placed injectable saline in the Expander using a sterile technique: Right: 50 cc for a total of 200/275 cc Left: 50 cc for a total of 200/275  cc  Pictures were obtained of the patient and placed in the chart with the patient's or guardian's permission.

## 2020-02-05 ENCOUNTER — Encounter: Payer: Self-pay | Admitting: Radiation Oncology

## 2020-02-05 ENCOUNTER — Ambulatory Visit
Admission: RE | Admit: 2020-02-05 | Discharge: 2020-02-05 | Disposition: A | Discharge status: Home / Self Care | Payer: 59 | Source: Ambulatory Visit | Attending: Radiation Oncology | Admitting: Radiation Oncology

## 2020-02-05 ENCOUNTER — Other Ambulatory Visit: Payer: Self-pay

## 2020-02-05 VITALS — BP 126/87 | HR 71 | Temp 97.6°F | Wt 117.0 lb

## 2020-02-05 DIAGNOSIS — Z79899 Other long term (current) drug therapy: Secondary | ICD-10-CM | POA: Diagnosis not present

## 2020-02-05 DIAGNOSIS — D0512 Intraductal carcinoma in situ of left breast: Secondary | ICD-10-CM | POA: Insufficient documentation

## 2020-02-05 DIAGNOSIS — F419 Anxiety disorder, unspecified: Secondary | ICD-10-CM | POA: Diagnosis not present

## 2020-02-05 DIAGNOSIS — Z9013 Acquired absence of bilateral breasts and nipples: Secondary | ICD-10-CM | POA: Diagnosis not present

## 2020-02-05 DIAGNOSIS — Z85810 Personal history of malignant neoplasm of tongue: Secondary | ICD-10-CM | POA: Diagnosis not present

## 2020-02-05 DIAGNOSIS — F17210 Nicotine dependence, cigarettes, uncomplicated: Secondary | ICD-10-CM | POA: Diagnosis not present

## 2020-02-05 DIAGNOSIS — Z803 Family history of malignant neoplasm of breast: Secondary | ICD-10-CM | POA: Diagnosis not present

## 2020-02-05 DIAGNOSIS — Z170 Estrogen receptor positive status [ER+]: Secondary | ICD-10-CM | POA: Diagnosis not present

## 2020-02-05 NOTE — Consult Note (Signed)
NEW PATIENT EVALUATION  Name: Danielle Bishop  MRN: [id number]  Date:   02/05/2020     DOB: 1971/06/07   This 49 y.o. 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  REFERRING PHYSICIAN: Juline Patch, MD  CHIEF COMPLAINT:  Chief Complaint  Patient presents with  . Consult    DIAGNOSIS: The encounter diagnosis was Ductal carcinoma in situ (DCIS) of left breast.   PREVIOUS INVESTIGATIONS:  Mammogram ultrasound and MRI scans reviewed Clinical notes reviewed Pathology report reviewed  HPI: Patient is a 49 year old female who presented with nipple discharge and an abnormal mammogram showing a solid mass in the 7 o'clock position of the left breast lower outer quadrant.  Biopsy was performed showing ductal carcinoma in situ.  Patient had multiple excisions all positive and eventually underwent bilateral nipple sparing mastectomies with pathology showing a 6.5 cm area of grade 2 DCIS focally involving the anterior margin and less than 1 mm from the posterior margin.  1 sentinel lymph node was negative tumor was ER positive PR positive.  Patient is undergoing breast reconstruction currently has expanders in place.  She will have her final expanders replaced with permanent implants in the near future and we will go ahead with radiation therapy to the left chest wall and reconstructed breast after that.  She is seen today and is doing well she specifically denies breast tenderness cough or bone pain.  Right breast showed initially atypical lobular hyperplasia.  PLANNED TREATMENT REGIMEN: Left breast hypofractionated radiation therapy  PAST MEDICAL HISTORY:  has a past medical history of Anxiety, Breast cancer, left ([place]), Family history of breast cancer, Family history of cancer of tongue, and Family history of skin cancer.    PAST  SURGICAL HISTORY:  Past Surgical History:  Procedure Laterality Date  . BREAST RECONSTRUCTION WITH PLACEMENT OF TISSUE EXPANDER AND FLEX HD (ACELLULAR HYDRATED DERMIS) Bilateral 01/01/2020   Procedure: BREAST RECONSTRUCTION WITH PLACEMENT OF TISSUE EXPANDER AND FLEX HD (ACELLULAR HYDRATED DERMIS);  Surgeon: [name], MD;  Location: [place];  Service: Plastics;  Laterality: Bilateral;  . MASTECTOMY W/ SENTINEL NODE BIOPSY Bilateral 01/01/2020   Procedure: BILATERAL NIPPLE SPARING MASTECTOMY WITH LEFT SENTINEL LYMPH NODE BIOPSY;  Surgeon: Stark Klein, MD;  Location: [place];  Service: General;  Laterality: Bilateral;  PEC BLOCK  . RADIOACTIVE SEED GUIDED EXCISIONAL BREAST BIOPSY Left 05/21/2019   Procedure: LEFT RADIOACTIVE SEED GUIDED EXCISIONAL BREAST BIOPSY;  Surgeon: Stark Klein, MD;  Location: [place];  Service: General;  Laterality: Left;  . RE-EXCISION OF BREAST LUMPECTOMY Left 06/19/2019   Procedure: RE-EXCISION OF LEFT BREAST LUMPECTOMY;  Surgeon: Stark Klein, MD;  Location: [place];  Service: General;  Laterality: Left;    FAMILY HISTORY: family history includes Breast cancer in some other family members; Breast cancer (age of onset: 75) in her maternal grandmother; Breast cancer (age of onset: 28) in an other family member; Cancer in an other family member; Skin cancer in her paternal grandfather and paternal grandmother; Stroke in her mother; Tongue cancer in an other family member.  SOCIAL HISTORY:  reports that she has been smoking cigarettes. She has a 10.00 pack-year smoking history. She has never used smokeless tobacco. She reports current alcohol use. She reports that she does not use drugs.  ALLERGIES: Patient has no  known allergies.  MEDICATIONS:  Current Outpatient Medications  Medication Sig Dispense Refill  . sertraline (ZOLOFT) 50 MG tablet Take 1 tablet (50 mg total) by mouth daily. 30 tablet  3  . ALPRAZolam (XANAX) 0.25 MG tablet Take 1 tablet (0.25 mg total) by mouth 2 (two) times daily as needed for anxiety. (Patient not taking: Reported on 02/05/2020) 20 tablet 1  . HYDROcodone-acetaminophen (NORCO) 10-325 MG tablet Take 1 tablet by mouth every 6 (six) hours as needed. (Patient not taking: Reported on 02/05/2020)     No current facility-administered medications for this encounter.    ECOG PERFORMANCE STATUS:  0 - Asymptomatic  REVIEW OF SYSTEMS: Patient denies any weight loss, fatigue, weakness, fever, chills or night sweats. Patient denies any loss of vision, blurred vision. Patient denies any ringing  of the ears or hearing loss. No irregular heartbeat. Patient denies heart murmur or history of fainting. Patient denies any chest pain or pain radiating to her upper extremities. Patient denies any shortness of breath, difficulty breathing at night, cough or hemoptysis. Patient denies any swelling in the lower legs. Patient denies any nausea vomiting, vomiting of blood, or coffee ground material in the vomitus. Patient denies any stomach pain. Patient states has had normal bowel movements no significant constipation or diarrhea. Patient denies any dysuria, hematuria or significant nocturia. Patient denies any problems walking, swelling in the joints or loss of balance. Patient denies any skin changes, loss of hair or loss of weight. Patient denies any excessive worrying or anxiety or significant depression. Patient denies any problems with insomnia. Patient denies excessive thirst, polyuria, polydipsia. Patient denies any swollen glands, patient denies easy bruising or easy bleeding. Patient denies any recent infections, allergies or URI. Patient "s visual fields have not changed significantly in recent time.   PHYSICAL EXAM: BP 126/87 (BP Location: Right Arm, Patient Position: Sitting, Cuff Size: Normal)   Pulse 71   Temp 97.6 F (36.4 C) (Tympanic)   Wt 117 lb (53.1 kg)   LMP  01/21/2020 (Exact Date)   BMI 20.73 kg/m  Patient has bilateral expanders present with excellent cosmetic result of breast reconstruction at this time.  No dominant mass or nodularity is noted in either reconstructed breast in 2 positions.  No axillary or supraclavicular adenopathy is identified.  Well-developed well-nourished patient in NAD. HEENT reveals PERLA, EOMI, discs not visualized.  Oral cavity is clear. No oral mucosal lesions are identified. Neck is clear without evidence of cervical or supraclavicular adenopathy. Lungs are clear to A&P. Cardiac examination is essentially unremarkable with regular rate and rhythm without murmur rub or thrill. Abdomen is benign with no organomegaly or masses noted. Motor sensory and DTR levels are equal and symmetric in the upper and lower extremities. Cranial nerves II through XII are grossly intact. Proprioception is intact. No peripheral adenopathy or edema is identified. No motor or sensory levels are noted. Crude visual fields are within normal range.  LABORATORY DATA: Pathology report reviewed    RADIOLOGY RESULTS: Mammograms ultrasounds and MRI scans are all reviewed compatible with above-stated findings   IMPRESSION: Stage 0 (Tis N0 M0) ER/PR positive ductal carcinoma in situ of the left breast status post mastectomy with positive margins undergoing breast reconstruction  PLAN: At this time I discussed with the plastic surgeon replacing her expanders with her permanent implants prior to radiation therapy.  I have scheduled to see the patient back about a week and a half after her final reconstruction to evaluate her status and go  ahead with treatment planning in anticipation of treating her about 3 to 4 weeks out from final surgery.  Risks and benefits of treatment including skin reaction fatigue alteration of blood counts possible slight contraction around her implants all were described in detail to the patient she seems to comprehend my treatment  plan well.  I will plan on delivering a hypofractionated course of treatment over 3 weeks.  I would like to take this opportunity to thank you for allowing me to participate in the care of your patient.Noreene Filbert, MD

## 2020-02-07 ENCOUNTER — Other Ambulatory Visit: Payer: Self-pay

## 2020-02-07 ENCOUNTER — Encounter (HOSPITAL_BASED_OUTPATIENT_CLINIC_OR_DEPARTMENT_OTHER): Payer: Self-pay | Admitting: Plastic Surgery

## 2020-02-12 ENCOUNTER — Other Ambulatory Visit
Admission: RE | Admit: 2020-02-12 | Discharge: 2020-02-12 | Disposition: A | Discharge status: Home / Self Care | Payer: 59 | Source: Ambulatory Visit | Attending: Plastic Surgery | Admitting: Plastic Surgery

## 2020-02-12 ENCOUNTER — Other Ambulatory Visit: Payer: Self-pay

## 2020-02-12 DIAGNOSIS — Z01812 Encounter for preprocedural laboratory examination: Secondary | ICD-10-CM | POA: Diagnosis present

## 2020-02-12 DIAGNOSIS — Z20822 Contact with and (suspected) exposure to covid-19: Secondary | ICD-10-CM | POA: Insufficient documentation

## 2020-02-12 LAB — SARS CORONAVIRUS 2 (TAT 6-24 HRS): SARS Coronavirus 2: NEGATIVE

## 2020-02-14 ENCOUNTER — Ambulatory Visit (HOSPITAL_BASED_OUTPATIENT_CLINIC_OR_DEPARTMENT_OTHER): Payer: 59 | Admitting: Anesthesiology

## 2020-02-14 ENCOUNTER — Encounter (HOSPITAL_BASED_OUTPATIENT_CLINIC_OR_DEPARTMENT_OTHER): Payer: Self-pay | Admitting: Plastic Surgery

## 2020-02-14 ENCOUNTER — Encounter (HOSPITAL_BASED_OUTPATIENT_CLINIC_OR_DEPARTMENT_OTHER)
Admission: RE | Disposition: A | Discharge status: Home / Self Care | Payer: Self-pay | Source: Home / Self Care | Attending: Plastic Surgery

## 2020-02-14 ENCOUNTER — Ambulatory Visit (HOSPITAL_BASED_OUTPATIENT_CLINIC_OR_DEPARTMENT_OTHER)
Admission: RE | Admit: 2020-02-14 | Discharge: 2020-02-14 | Disposition: A | Discharge status: Home / Self Care | Payer: 59 | Attending: Plastic Surgery | Admitting: Plastic Surgery

## 2020-02-14 ENCOUNTER — Other Ambulatory Visit: Payer: Self-pay

## 2020-02-14 DIAGNOSIS — Z9013 Acquired absence of bilateral breasts and nipples: Secondary | ICD-10-CM | POA: Diagnosis not present

## 2020-02-14 DIAGNOSIS — Z800 Family history of malignant neoplasm of digestive organs: Secondary | ICD-10-CM | POA: Diagnosis not present

## 2020-02-14 DIAGNOSIS — D0512 Intraductal carcinoma in situ of left breast: Secondary | ICD-10-CM

## 2020-02-14 DIAGNOSIS — Z421 Encounter for breast reconstruction following mastectomy: Secondary | ICD-10-CM | POA: Insufficient documentation

## 2020-02-14 DIAGNOSIS — F17210 Nicotine dependence, cigarettes, uncomplicated: Secondary | ICD-10-CM | POA: Insufficient documentation

## 2020-02-14 DIAGNOSIS — F419 Anxiety disorder, unspecified: Secondary | ICD-10-CM | POA: Insufficient documentation

## 2020-02-14 DIAGNOSIS — Z823 Family history of stroke: Secondary | ICD-10-CM | POA: Insufficient documentation

## 2020-02-14 DIAGNOSIS — Z853 Personal history of malignant neoplasm of breast: Secondary | ICD-10-CM | POA: Insufficient documentation

## 2020-02-14 DIAGNOSIS — Z803 Family history of malignant neoplasm of breast: Secondary | ICD-10-CM | POA: Insufficient documentation

## 2020-02-14 DIAGNOSIS — Z808 Family history of malignant neoplasm of other organs or systems: Secondary | ICD-10-CM | POA: Insufficient documentation

## 2020-02-14 HISTORY — PX: REMOVAL OF BILATERAL TISSUE EXPANDERS WITH PLACEMENT OF BILATERAL BREAST IMPLANTS: SHX6431

## 2020-02-14 LAB — POCT PREGNANCY, URINE: Preg Test, Ur: NEGATIVE

## 2020-02-14 SURGERY — REMOVAL, TISSUE EXPANDER, BREAST, BILATERAL, WITH BILATERAL IMPLANT IMPLANT INSERTION
Anesthesia: General | Site: Breast | Laterality: Bilateral

## 2020-02-14 MED ORDER — FENTANYL CITRATE (PF) 100 MCG/2ML IJ SOLN
INTRAMUSCULAR | Status: AC
  Filled 2020-02-14: qty 2

## 2020-02-14 MED ORDER — EPHEDRINE SULFATE 50 MG/ML IJ SOLN
INTRAMUSCULAR | Status: DC | PRN
  Administered 2020-02-14: 10 mg via INTRAVENOUS

## 2020-02-14 MED ORDER — HYDROCODONE-ACETAMINOPHEN 5-325 MG PO TABS
1.0000 | ORAL_TABLET | Freq: Four times a day (QID) | ORAL | 0 refills | Status: AC | PRN
Start: 2020-02-14 — End: 2020-02-19

## 2020-02-14 MED ORDER — ONDANSETRON HCL 4 MG/2ML IJ SOLN
INTRAMUSCULAR | Status: DC | PRN
  Administered 2020-02-14: 4 mg via INTRAVENOUS

## 2020-02-14 MED ORDER — SODIUM CHLORIDE 0.9 % IV SOLN: INTRAVENOUS | Status: DC | PRN

## 2020-02-14 MED ORDER — LIDOCAINE 2% (20 MG/ML) 5 ML SYRINGE
INTRAMUSCULAR | Status: AC
  Filled 2020-02-14: qty 5

## 2020-02-14 MED ORDER — EPHEDRINE 5 MG/ML INJ
INTRAVENOUS | Status: AC
  Filled 2020-02-14: qty 10

## 2020-02-14 MED ORDER — DEXAMETHASONE SODIUM PHOSPHATE 10 MG/ML IJ SOLN
INTRAMUSCULAR | Status: AC
  Filled 2020-02-14: qty 1

## 2020-02-14 MED ORDER — FENTANYL CITRATE (PF) 100 MCG/2ML IJ SOLN: 25.0000 ug | INTRAMUSCULAR | Status: DC | PRN

## 2020-02-14 MED ORDER — ONDANSETRON HCL 4 MG PO TABS: 4.0000 mg | ORAL_TABLET | Freq: Three times a day (TID) | ORAL | 0 refills | Status: DC | PRN

## 2020-02-14 MED ORDER — CEFAZOLIN SODIUM-DEXTROSE 2-4 GM/100ML-% IV SOLN
2.0000 g | INTRAVENOUS | Status: AC
  Administered 2020-02-14: 2 g via INTRAVENOUS

## 2020-02-14 MED ORDER — FENTANYL CITRATE (PF) 100 MCG/2ML IJ SOLN
INTRAMUSCULAR | Status: DC | PRN
Start: 2020-02-14 — End: 2020-02-14
  Administered 2020-02-14: 100 ug via INTRAVENOUS

## 2020-02-14 MED ORDER — ONDANSETRON HCL 4 MG/2ML IJ SOLN
INTRAMUSCULAR | Status: AC
  Filled 2020-02-14: qty 2

## 2020-02-14 MED ORDER — BUPIVACAINE-EPINEPHRINE (PF) 0.25% -1:200000 IJ SOLN
INTRAMUSCULAR | Status: AC
  Filled 2020-02-14: qty 30

## 2020-02-14 MED ORDER — DEXAMETHASONE SODIUM PHOSPHATE 4 MG/ML IJ SOLN
INTRAMUSCULAR | Status: DC | PRN
  Administered 2020-02-14: 10 mg via INTRAVENOUS

## 2020-02-14 MED ORDER — PROPOFOL 10 MG/ML IV BOLUS
INTRAVENOUS | Status: AC
  Filled 2020-02-14: qty 20

## 2020-02-14 MED ORDER — ROCURONIUM BROMIDE 10 MG/ML (PF) SYRINGE
PREFILLED_SYRINGE | INTRAVENOUS | Status: AC
  Filled 2020-02-14: qty 10

## 2020-02-14 MED ORDER — MIDAZOLAM HCL 2 MG/2ML IJ SOLN
INTRAMUSCULAR | Status: AC
  Filled 2020-02-14: qty 2

## 2020-02-14 MED ORDER — PROPOFOL 10 MG/ML IV BOLUS
INTRAVENOUS | Status: DC | PRN
  Administered 2020-02-14: 170 mg via INTRAVENOUS
  Administered 2020-02-14: 20 mg via INTRAVENOUS

## 2020-02-14 MED ORDER — LIDOCAINE HCL (CARDIAC) PF 100 MG/5ML IV SOSY
PREFILLED_SYRINGE | INTRAVENOUS | Status: DC | PRN
  Administered 2020-02-14: 80 mg via INTRAVENOUS

## 2020-02-14 MED ORDER — MIDAZOLAM HCL 5 MG/5ML IJ SOLN
INTRAMUSCULAR | Status: DC | PRN
  Administered 2020-02-14: 2 mg via INTRAVENOUS

## 2020-02-14 MED ORDER — CEFAZOLIN SODIUM-DEXTROSE 2-4 GM/100ML-% IV SOLN
INTRAVENOUS | Status: AC
  Filled 2020-02-14: qty 100

## 2020-02-14 MED ORDER — SUGAMMADEX SODIUM 200 MG/2ML IV SOLN
INTRAVENOUS | Status: DC | PRN
  Administered 2020-02-14: 125 mg via INTRAVENOUS

## 2020-02-14 MED ORDER — ROCURONIUM BROMIDE 100 MG/10ML IV SOLN
INTRAVENOUS | Status: DC | PRN
  Administered 2020-02-14: 50 mg via INTRAVENOUS

## 2020-02-14 MED ORDER — LACTATED RINGERS IV SOLN: INTRAVENOUS | Status: DC

## 2020-02-14 SURGICAL SUPPLY — 65 items
APL PRP STRL LF DISP 70% ISPRP (MISCELLANEOUS)
BAG DECANTER FOR FLEXI CONT (MISCELLANEOUS) ×3 IMPLANT
BLADE SURG 10 STRL SS (BLADE) IMPLANT
BLADE SURG 15 STRL LF DISP TIS (BLADE) ×1 IMPLANT
BLADE SURG 15 STRL SS (BLADE) ×3
BNDG ELASTIC 6X5.8 VLCR STR LF (GAUZE/BANDAGES/DRESSINGS) ×3 IMPLANT
BNDG GAUZE ELAST 4 BULKY (GAUZE/BANDAGES/DRESSINGS) ×6 IMPLANT
CANISTER SUCT 1200ML W/VALVE (MISCELLANEOUS) ×3 IMPLANT
CHLORAPREP W/TINT 26 (MISCELLANEOUS) IMPLANT
CLOSURE WOUND 1/2 X4 (GAUZE/BANDAGES/DRESSINGS) ×1
COVER BACK TABLE 60X90IN (DRAPES) ×3 IMPLANT
COVER MAYO STAND STRL (DRAPES) ×3 IMPLANT
COVER WAND RF STERILE (DRAPES) IMPLANT
DECANTER SPIKE VIAL GLASS SM (MISCELLANEOUS) ×3 IMPLANT
DRAIN CHANNEL 15F RND FF W/TCR (WOUND CARE) IMPLANT
DRAPE LAPAROSCOPIC ABDOMINAL (DRAPES) ×3 IMPLANT
DRAPE UTILITY XL STRL (DRAPES) ×3 IMPLANT
DRSG PAD ABDOMINAL 8X10 ST (GAUZE/BANDAGES/DRESSINGS) ×6 IMPLANT
DURAPREP 26ML APPLICATOR (WOUND CARE) ×3 IMPLANT
ELECT BLADE 4.0 EZ CLEAN MEGAD (MISCELLANEOUS)
ELECT COATED BLADE 2.86 ST (ELECTRODE) ×3 IMPLANT
ELECT REM PT RETURN 9FT ADLT (ELECTROSURGICAL) ×3
ELECTRODE BLDE 4.0 EZ CLN MEGD (MISCELLANEOUS) IMPLANT
ELECTRODE REM PT RTRN 9FT ADLT (ELECTROSURGICAL) ×1 IMPLANT
EVACUATOR SILICONE 100CC (DRAIN) IMPLANT
FUNNEL KELLER 2 DISP (MISCELLANEOUS) IMPLANT
GAUZE SPONGE 4X4 12PLY STRL LF (GAUZE/BANDAGES/DRESSINGS) ×3 IMPLANT
GLOVE BIO SURGEON STRL SZ 6 (GLOVE) IMPLANT
GLOVE BIOGEL M STRL SZ7.5 (GLOVE) ×9 IMPLANT
GLOVE BIOGEL PI IND STRL 7.0 (GLOVE) ×1 IMPLANT
GLOVE BIOGEL PI IND STRL 8 (GLOVE) ×1 IMPLANT
GLOVE BIOGEL PI INDICATOR 7.0 (GLOVE) ×2
GLOVE BIOGEL PI INDICATOR 8 (GLOVE) ×2
GLOVE ECLIPSE 6.5 STRL STRAW (GLOVE) ×3 IMPLANT
GOWN STRL REUS W/ TWL LRG LVL3 (GOWN DISPOSABLE) ×3 IMPLANT
GOWN STRL REUS W/TWL LRG LVL3 (GOWN DISPOSABLE) ×9
IMPL BREAST MP 215CC (Breast) ×2 IMPLANT
IMPLANT BREAST MP 215CC (Breast) ×6 IMPLANT
IV NS 500ML (IV SOLUTION)
IV NS 500ML BAXH (IV SOLUTION) IMPLANT
KIT FILL SYSTEM UNIVERSAL (SET/KITS/TRAYS/PACK) IMPLANT
MARKER SKIN DUAL TIP RULER LAB (MISCELLANEOUS) IMPLANT
NEEDLE HYPO 25X1 1.5 SAFETY (NEEDLE) ×3 IMPLANT
PACK BASIN DAY SURGERY FS (CUSTOM PROCEDURE TRAY) ×3 IMPLANT
PENCIL SMOKE EVACUATOR (MISCELLANEOUS) ×3 IMPLANT
PIN SAFETY STERILE (MISCELLANEOUS) IMPLANT
SIZER BREAST REUSABLE 215CC (SIZER) ×6
SIZER BRST REUSABLE 215CC (SIZER) ×2 IMPLANT
SLEEVE SCD COMPRESS KNEE MED (MISCELLANEOUS) ×3 IMPLANT
SPONGE LAP 18X18 RF (DISPOSABLE) ×3 IMPLANT
STAPLER VISISTAT 35W (STAPLE) ×3 IMPLANT
STRIP CLOSURE SKIN 1/2X4 (GAUZE/BANDAGES/DRESSINGS) ×2 IMPLANT
SUT ETHILON 2 0 FS 18 (SUTURE) IMPLANT
SUT PDS 3-0 CT2 (SUTURE) ×6
SUT PDS II 3-0 CT2 27 ABS (SUTURE) ×2 IMPLANT
SUT SILK 2 0 SH (SUTURE) IMPLANT
SUT VLOC 180 0 24IN GS25 (SUTURE) ×3 IMPLANT
SUT VLOC 90 P-14 23 (SUTURE) ×6 IMPLANT
SYR BULB IRRIG 60ML STRL (SYRINGE) IMPLANT
SYR CONTROL 10ML LL (SYRINGE) ×3 IMPLANT
TOWEL GREEN STERILE FF (TOWEL DISPOSABLE) ×3 IMPLANT
TUBE CONNECTING 20'X1/4 (TUBING) ×1
TUBE CONNECTING 20X1/4 (TUBING) ×2 IMPLANT
UNDERPAD 30X36 HEAVY ABSORB (UNDERPADS AND DIAPERS) ×3 IMPLANT
YANKAUER SUCT BULB TIP NO VENT (SUCTIONS) ×3 IMPLANT

## 2020-02-14 NOTE — H&P (Signed)
  Reason for Consult/CC:Breast recon  Danielle Bishop is an 49 y.o. female.  HPI: Pt presents for exchange of expanders for implants.  Allergies: No Known Allergies  Medications:  Current Facility-Administered Medications:  .  ceFAZolin (ANCEF) IVPB 2g/100 mL premix, 2 g, Intravenous, On Call to OR, [name], MD .  lactated ringers infusion, , Intravenous, Continuous, Lynda Rainwater, MD, Last Rate: 10 mL/hr at 02/14/20 0750, New Bag at 02/14/20 0750  Past Medical History:  Diagnosis Date  . Anxiety   . Breast cancer, left [facility])    dx 09/ 2020 by bx with atypical ductal hyperplasia/ papillnoma;   s/p  lefft breast excsional bx 05-21-2019 ,  dx DCIS  . Family history of breast cancer   . Family history of cancer of tongue   . Family history of skin cancer     Past Surgical History:  Procedure Laterality Date  . BREAST RECONSTRUCTION WITH PLACEMENT OF TISSUE EXPANDER AND FLEX HD (ACELLULAR HYDRATED DERMIS) Bilateral 01/01/2020   Procedure: BREAST RECONSTRUCTION WITH PLACEMENT OF TISSUE EXPANDER AND FLEX HD (ACELLULAR HYDRATED DERMIS);  Surgeon: [name], MD;  Location: [place];  Service: Plastics;  Laterality: Bilateral;  . MASTECTOMY W/ SENTINEL NODE BIOPSY Bilateral 01/01/2020   Procedure: BILATERAL NIPPLE SPARING MASTECTOMY WITH LEFT SENTINEL LYMPH NODE BIOPSY;  Surgeon: Stark Klein, MD;  Location: [place];  Service: General;  Laterality: Bilateral;  PEC BLOCK  . RADIOACTIVE SEED GUIDED EXCISIONAL BREAST BIOPSY Left 05/21/2019   Procedure: LEFT RADIOACTIVE SEED GUIDED EXCISIONAL BREAST BIOPSY;  Surgeon: Stark Klein, MD;  Location: [place];  Service: General;  Laterality: Left;  . RE-EXCISION OF BREAST LUMPECTOMY Left 06/19/2019   Procedure: RE-EXCISION OF LEFT BREAST LUMPECTOMY;  Surgeon: Stark Klein, MD;  Location: [place];  Service: General;  Laterality: Left;    Family History  Problem Relation  Age of Onset  . Stroke Mother   . Breast cancer Maternal Grandmother 52  . Skin cancer Paternal Grandmother   . Skin cancer Paternal Grandfather   . Breast cancer Other   . Tongue cancer Other   . Cancer Other        unknown type  . Breast cancer Other 90  . Breast cancer Other     Social History:  reports that she has been smoking cigarettes. She has a 10.00 pack-year smoking history. She has never used smokeless tobacco. She reports current alcohol use. She reports that she does not use drugs.  Physical Exam Blood pressure 102/64, pulse 63, temperature 98.2 F (36.8 C), temperature source Oral, resp. rate 18, height 5\' 3"  (1.6 m), weight 53.5 kg, last menstrual period 02/10/2020, SpO2 100 %. General: NAD, AOx3 CV: RRR Pulm: Unlabored  Results for orders placed or performed during the hospital encounter of 02/14/20 (from the past 48 hour(s))  Pregnancy, urine POC     Status: None   Collection Time: 02/14/20  7:32 AM  Result Value Ref Range   Preg Test, Ur NEGATIVE NEGATIVE    Comment:        THE SENSITIVITY OF THIS METHODOLOGY IS >24 mIU/mL     No results found.  Assessment/Plan: Proceed for exchange of expanders to implants.  Risks and benefits discussed.  Patient understands.  [name] 02/14/2020, 8:30 AM

## 2020-02-14 NOTE — Discharge Instructions (Addendum)
Activity As tolerated: NO showers for 3 days. Keep ACE wrap on breasts until then. After showering, put ACE wrap back on, this is important for compression. NO driving while in pain, taking pain medication or if you are unable to safely react to traffic. No heavy activities Take Pain medication (Norco) as needed for severe pain. Otherwise, you can use ibuprofen or tylenol as needed. Avoid more than 3,000 mg of tylenol in 24 hours. Norco has 325mg  of tylenol per dose.  Diet: Regular. Drink plenty of fluids and eat healthy, high protein, low carbs.  Wound Care: Keep dressing clean & dry. You may change bandages after showering if you continue to notice some drainage.   Special Instructions: Call Doctor if any unusual problems occur such as pain, excessive Bleeding, unrelieved Nausea/vomiting, Fever &/or chills  Follow-up appointment: Scheduled for next week. Post Anesthesia Home Care Instructions  Activity: Get plenty of rest for the remainder of the day. A responsible individual must stay with you for 24 hours following the procedure.  For the next 24 hours, DO NOT: -Drive a car -Paediatric nurse -Drink alcoholic beverages -Take any medication unless instructed by your physician -Make any legal decisions or sign important papers.  Meals: Start with liquid foods such as gelatin or soup. Progress to regular foods as tolerated. Avoid greasy, spicy, heavy foods. If nausea and/or vomiting occur, drink only clear liquids until the nausea and/or vomiting subsides. Call your physician if vomiting continues.  Special Instructions/Symptoms: Your throat may feel dry or sore from the anesthesia or the breathing tube placed in your throat during surgery. If this causes discomfort, gargle with warm salt water. The discomfort should disappear within 24 hours.  If you had a scopolamine patch placed behind your ear for the management of post- operative nausea and/or vomiting:  1. The medication in the  patch is effective for 72 hours, after which it should be removed.  Wrap patch in a tissue and discard in the trash. Wash hands thoroughly with soap and water. 2. You may remove the patch earlier than 72 hours if you experience unpleasant side effects which may include dry mouth, dizziness or visual disturbances. 3. Avoid touching the patch. Wash your hands with soap and water after contact with the patch.

## 2020-02-14 NOTE — Op Note (Signed)
Operative Note   DATE OF OPERATION: 02/14/2020  SURGICAL DEPARTMENT: Plastic Surgery  PREOPERATIVE DIAGNOSES:  Bilateral mastectomy defects status post tissue expander reconstruction  POSTOPERATIVE DIAGNOSES:  same  PROCEDURE:  1. Bilateral exchange of breast tissue expanders for silicone implants 2.  Bilateral capsulotomy  SURGEON: Talmadge Coventry, MD  ASSISTANT: Verdie Shire, PA The advanced practice practitioner (APP) assisted throughout the case.  The APP was essential in retraction and counter traction when needed to make the case progress smoothly.  This retraction and assistance made it possible to see the tissue plans for the procedure.  The assistance was needed for blood control, tissue re-approximation and assisted with closure of the incision site.  ANESTHESIA:  General.   COMPLICATIONS: None.   INDICATIONS FOR PROCEDURE:  The patient, Danielle Bishop is a 49 y.o. female born on 1971/01/11, is here for treatment of bilateral mastectomy defect status post tissue expander reconstruction. MRN: [id number]  CONSENT:  Informed consent was obtained directly from the patient. Risks, benefits and alternatives were fully discussed. Specific risks including but not limited to bleeding, infection, hematoma, seroma, scarring, pain, contracture, asymmetry, wound healing problems, and need for further surgery were all discussed. The patient did have an ample opportunity to have questions answered to satisfaction.   DESCRIPTION OF PROCEDURE:  The patient was taken to the operating room. SCDs were placed and Ancef antibiotics were given.  General anesthesia was administered.  The patient's operative site was prepped and draped in a sterile fashion. A time out was performed and all information was confirmed to be correct.  Started on the right side.  I incised along her previous scar with a 15 blade.  We dissected down to the expander with cautery.  The saline was evacuated and the expander was  removed.  I then performed a medial and superior capsulotomy using cautery to improve symmetry.  A 215 cc sizer was chosen and looked appropriate.  I then turned my attention to the left side.  The scar was incised with a 15 blade.  The expander fluid was evacuated and the expander was removed.  An inferior and medial capsulotomy was performed using cautery.  215 cc sizer was chosen and looks symmetric with the right side.  I then removed both sizers and irrigated both pockets with antibiotic irrigation.  I changed my gloves and the implants were brought into the field.  I chose Mentor smooth round moderate plus profile gel implants that were 215 cc in size.  The serial number on the right side is [id number].  The serial number on the left is [id number].  After these were placed both wounds were closed using buried PDS sutures for the capsule and the dermis.  A 3 OV lock suture was run from the final closure on both sides.  This was covered with Steri-Strips, soft dressing and an Ace wrap.  The patient tolerated the procedure well.  There were no complications. The patient was allowed to wake from anesthesia, extubated and taken to the recovery room in satisfactory condition.

## 2020-02-14 NOTE — Anesthesia Preprocedure Evaluation (Addendum)
Anesthesia Evaluation  Patient identified by MRN, date of birth, ID band Patient awake    Reviewed: Allergy & Precautions, NPO status   Airway Mallampati: II  TM Distance: >3 FB     Dental   Pulmonary Current Smoker and Patient abstained from smoking.,    breath sounds clear to auscultation       Cardiovascular negative cardio ROS   Rhythm:Regular Rate:Normal     Neuro/Psych Anxiety    GI/Hepatic negative GI ROS, Neg liver ROS,   Endo/Other  negative endocrine ROS  Renal/GU negative Renal ROS     Musculoskeletal   Abdominal   Peds  Hematology   Anesthesia Other Findings   Reproductive/Obstetrics                             Anesthesia Physical Anesthesia Plan  ASA: II  Anesthesia Plan: General   Post-op Pain Management:    Induction: Intravenous  PONV Risk Score and Plan: 2 and Ondansetron, Midazolam and Dexamethasone  Airway Management Planned: Oral ETT  Additional Equipment:   Intra-op Plan:   Post-operative Plan: Extubation in OR  Informed Consent: I have reviewed the patients History and Physical, chart, labs and discussed the procedure including the risks, benefits and alternatives for the proposed anesthesia with the patient or authorized representative who has indicated his/her understanding and acceptance.       Plan Discussed with: CRNA and Anesthesiologist  Anesthesia Plan Comments:         Anesthesia Quick Evaluation

## 2020-02-14 NOTE — Anesthesia Procedure Notes (Signed)
Procedure Name: Intubation Date/Time: 02/14/2020 9:05 AM Performed by: Glory Buff, CRNA Pre-anesthesia Checklist: Patient identified, Emergency Drugs available, Suction available and Patient being monitored Patient Re-evaluated:Patient Re-evaluated prior to induction Oxygen Delivery Method: Circle system utilized Preoxygenation: Pre-oxygenation with 100% oxygen Induction Type: IV induction Ventilation: Mask ventilation without difficulty Laryngoscope Size: Miller and 3 Grade View: Grade I Tube type: Oral Tube size: 7.0 mm Number of attempts: 1 Airway Equipment and Method: Stylet Placement Confirmation: ETT inserted through vocal cords under direct vision,  positive ETCO2 and breath sounds checked- equal and bilateral Secured at: 21 cm Tube secured with: Tape Dental Injury: Teeth and Oropharynx as per pre-operative assessment

## 2020-02-14 NOTE — Transfer of Care (Signed)
Immediate Anesthesia Transfer of Care Note  Patient: Danielle Bishop  Procedure(s) Performed: REMOVAL OF BILATERAL TISSUE EXPANDERS WITH PLACEMENT OF BILATERAL BREAST IMPLANTS (Bilateral Breast)  Patient Location: PACU  Anesthesia Type:General  Level of Consciousness: drowsy, patient cooperative and responds to stimulation  Airway & Oxygen Therapy: Patient Spontanous Breathing and Patient connected to face mask oxygen  Post-op Assessment: Report given to RN and Post -op Vital signs reviewed and stable  Post vital signs: Reviewed and stable  Last Vitals:  Vitals Value Taken Time  BP 130/80 02/14/20 1021  Temp    Pulse 103 02/14/20 1021  Resp 14 02/14/20 1021  SpO2 100 % 02/14/20 1021  Vitals shown include unvalidated device data.  Last Pain:  Vitals:   02/14/20 0746  TempSrc: Oral  PainSc: 0-No pain         Complications: No complications documented.

## 2020-02-14 NOTE — Interval H&P Note (Signed)
History and Physical Interval Note:  02/14/2020 8:32 AM  Danielle Bishop  has presented today for surgery, with the diagnosis of History of breast cancer - Ductal Carcinoma In Situ Of Left Breast.  The various methods of treatment have been discussed with the patient and family. After consideration of risks, benefits and other options for treatment, the patient has consented to  Procedure(s) with comments: REMOVAL OF BILATERAL TISSUE EXPANDERS WITH PLACEMENT OF BILATERAL BREAST IMPLANTS (Bilateral) - 90 min as a surgical intervention.  The patient's history has been reviewed, patient examined, no change in status, stable for surgery.  I have reviewed the patient's chart and labs.  Questions were answered to the patient's satisfaction.     [name]

## 2020-02-14 NOTE — Anesthesia Postprocedure Evaluation (Signed)
Anesthesia Post Note  Patient: Danielle Bishop  Procedure(s) Performed: REMOVAL OF BILATERAL TISSUE EXPANDERS WITH PLACEMENT OF BILATERAL BREAST IMPLANTS (Bilateral Breast)     Patient location during evaluation: PACU Anesthesia Type: General Level of consciousness: awake Pain management: pain level controlled Vital Signs Assessment: post-procedure vital signs reviewed and stable Respiratory status: spontaneous breathing Cardiovascular status: stable Postop Assessment: no apparent nausea or vomiting Anesthetic complications: no   No complications documented.  Last Vitals:  Vitals:   02/14/20 1045 02/14/20 1130  BP: 124/81 120/82  Pulse: 79 77  Resp: 18 16  Temp:  37 C  SpO2: 100% 100%    Last Pain:  Vitals:   02/14/20 1130  TempSrc:   PainSc: 0-No pain                 Tearah Saulsbury

## 2020-02-14 NOTE — Brief Op Note (Signed)
02/14/2020  10:14 AM  PATIENT:  Danielle Bishop 71  49 y.o. 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  PROCEDURE:  Procedure(s) with comments: REMOVAL OF BILATERAL TISSUE EXPANDERS WITH PLACEMENT OF BILATERAL BREAST IMPLANTS (Bilateral) - 90 min  SURGEON:  Surgeon(s) and Role:    * Genie Mirabal, Steffanie Dunn, MD - Primary  PHYSICIAN ASSISTANT: Software engineer, PA  ASSISTANTS: none   ANESTHESIA:   general  EBL:  20   BLOOD ADMINISTERED:none  DRAINS: none   LOCAL MEDICATIONS USED:  NONE  SPECIMEN:  No Specimen  DISPOSITION OF SPECIMEN:  PATHOLOGY  COUNTS:  YES  TOURNIQUET:  * No tourniquets in log *  DICTATION: .Dragon Dictation  PLAN OF CARE: Discharge to home after PACU  PATIENT DISPOSITION:  PACU - hemodynamically stable.   Delay start of Pharmacological VTE agent (>24hrs) due to surgical blood loss or risk of bleeding: not applicable

## 2020-02-16 ENCOUNTER — Other Ambulatory Visit: Payer: Self-pay | Admitting: Family Medicine

## 2020-02-16 DIAGNOSIS — F329 Major depressive disorder, single episode, unspecified: Secondary | ICD-10-CM

## 2020-02-16 DIAGNOSIS — F419 Anxiety disorder, unspecified: Secondary | ICD-10-CM

## 2020-02-16 NOTE — Telephone Encounter (Signed)
Requested Prescriptions  Pending Prescriptions Disp Refills  . sertraline (ZOLOFT) 50 MG tablet [Pharmacy Med Name: SERTRALINE 50MG  TABLETS] 90 tablet 0    Sig: TAKE 1 TABLET(50 MG) BY MOUTH DAILY     Psychiatry:  Antidepressants - SSRI Passed - 02/16/2020 10:33 AM      Passed - Valid encounter within last 6 months    Recent Outpatient Visits          4 months ago Reactive depression   [facility] Juline Patch, MD   1 year ago Discharge from left nipple   [facility] Juline Patch, MD   2 years ago Establishing care with new doctor, encounter for   [facility] Juline Patch, MD

## 2020-02-18 ENCOUNTER — Encounter (HOSPITAL_BASED_OUTPATIENT_CLINIC_OR_DEPARTMENT_OTHER): Payer: Self-pay | Admitting: Plastic Surgery

## 2020-02-20 ENCOUNTER — Encounter: Payer: 59 | Admitting: Plastic Surgery

## 2020-02-26 ENCOUNTER — Other Ambulatory Visit: Payer: Self-pay

## 2020-02-26 ENCOUNTER — Encounter: Payer: Self-pay | Admitting: Plastic Surgery

## 2020-02-26 ENCOUNTER — Ambulatory Visit (INDEPENDENT_AMBULATORY_CARE_PROVIDER_SITE_OTHER): Payer: 59 | Admitting: Plastic Surgery

## 2020-02-26 VITALS — BP 123/84 | HR 76 | Temp 98.9°F

## 2020-02-26 DIAGNOSIS — D0512 Intraductal carcinoma in situ of left breast: Secondary | ICD-10-CM

## 2020-02-26 NOTE — Progress Notes (Signed)
Patient presents 1 week postop from exchange of bilateral breast tissue expanders for silicone gel implants.  She feels great.  On exam everything looks to be healing nicely.  She has good shape size and symmetry.  There is no subcutaneous fluid.  She should start radiation in the next few weeks and I will plan to check in with her in about 4 to 6 weeks time to see how she is coming along.  I have asked her to continue to avoid strenuous activity for another few weeks and to call us if she should notice anything that concerns her between now and then.

## 2020-03-26 ENCOUNTER — Ambulatory Visit: Payer: 59 | Admitting: Plastic Surgery

## 2020-04-01 ENCOUNTER — Ambulatory Visit
Admission: RE | Admit: 2020-04-01 | Discharge: 2020-04-01 | Disposition: A | Discharge status: Home / Self Care | Payer: 59 | Source: Ambulatory Visit | Attending: Radiation Oncology | Admitting: Radiation Oncology

## 2020-04-01 ENCOUNTER — Encounter: Payer: Self-pay | Admitting: Radiation Oncology

## 2020-04-01 ENCOUNTER — Other Ambulatory Visit: Payer: Self-pay

## 2020-04-01 VITALS — BP 118/86 | HR 84 | Temp 97.3°F | Resp 16 | Wt 119.2 lb

## 2020-04-01 DIAGNOSIS — Z170 Estrogen receptor positive status [ER+]: Secondary | ICD-10-CM | POA: Insufficient documentation

## 2020-04-01 DIAGNOSIS — D0512 Intraductal carcinoma in situ of left breast: Secondary | ICD-10-CM | POA: Insufficient documentation

## 2020-04-01 DIAGNOSIS — Z9013 Acquired absence of bilateral breasts and nipples: Secondary | ICD-10-CM | POA: Insufficient documentation

## 2020-04-01 NOTE — Progress Notes (Signed)
Radiation Oncology Follow up Note  Name: Danielle Bishop   Date:   04/01/2020 MRN:  [id number] DOB: May 09, 1971    This 49 y.o. female presents to the clinic today for follow-up status post completion of bilateral breast reconstruction and patient with ductal carcinoma in situ of the left breast.  REFERRING PROVIDER: Juline Patch, MD  HPI: Patient is a 49 year old female originally consulted back in August for stage 0 (Tis N0 M0) ductal carcinoma in situ of the left breast status post bilateral mastectomies with nipple sparing approach with positive margins..  Her tumor was strongly ER/PR positive.  She is completed her reconstruction with sacrifice of nipple areolar complex.  She is now seen to commence with her whole breast radiation.  COMPLICATIONS OF TREATMENT: none  FOLLOW UP COMPLIANCE: keeps appointments   PHYSICAL EXAM:  BP 118/86 (BP Location: Left Arm, Patient Position: Sitting)    Pulse 84    Temp (!) 97.3 F (36.3 C) (Tympanic)    Resp 16    Wt 119 lb 3.2 oz (54.1 kg)    BMI 21.12 kg/m  Patient status post bilateral mastectomies with reconstruction.  Cosmetic result is good to excellent.  There is been sacrifice of the nipple areolar complex.  No dominant mass or nodularity is noted in either reconstructed breast.  No axillary or supraclavicular adenopathy is identified.  Well-developed well-nourished patient in NAD. HEENT reveals PERLA, EOMI, discs not visualized.  Oral cavity is clear. No oral mucosal lesions are identified. Neck is clear without evidence of cervical or supraclavicular adenopathy. Lungs are clear to A&P. Cardiac examination is essentially unremarkable with regular rate and rhythm without murmur rub or thrill. Abdomen is benign with no organomegaly or masses noted. Motor sensory and DTR levels are equal and symmetric in the upper and lower extremities. Cranial nerves II through XII are grossly intact. Proprioception is intact. No peripheral adenopathy or edema is  identified. No motor or sensory levels are noted. Crude visual fields are within normal range.  RADIOLOGY RESULTS: No films for review  PLAN: This time like to go ahead with whole breast radiation to the left breast.  We will treat her whole breast over 3 weeks.  Do not see need to boost second her breast reconstruction.  Risks and benefits of treatment including skin reaction fatigue alteration blood counts possible inclusion of superficial lung all were described in detail.  Patient comprehends my recommendations well.  I personally set up and ordered CT simulation for next week.  I would like to take this opportunity to thank you for allowing me to participate in the care of your patient.Noreene Filbert, MD

## 2020-04-08 ENCOUNTER — Ambulatory Visit
Admission: RE | Admit: 2020-04-08 | Discharge: 2020-04-08 | Disposition: A | Discharge status: Home / Self Care | Payer: 59 | Source: Ambulatory Visit | Attending: Radiation Oncology | Admitting: Radiation Oncology

## 2020-04-08 DIAGNOSIS — D0512 Intraductal carcinoma in situ of left breast: Secondary | ICD-10-CM | POA: Diagnosis present

## 2020-04-08 DIAGNOSIS — Z510 Encounter for antineoplastic radiation therapy: Secondary | ICD-10-CM | POA: Insufficient documentation

## 2020-04-09 ENCOUNTER — Other Ambulatory Visit: Payer: Self-pay | Admitting: *Deleted

## 2020-04-09 DIAGNOSIS — D0512 Intraductal carcinoma in situ of left breast: Secondary | ICD-10-CM

## 2020-04-09 DIAGNOSIS — Z510 Encounter for antineoplastic radiation therapy: Secondary | ICD-10-CM | POA: Diagnosis not present

## 2020-04-13 ENCOUNTER — Ambulatory Visit: Payer: 59 | Admitting: Hematology and Oncology

## 2020-04-15 ENCOUNTER — Ambulatory Visit: Admission: RE | Admit: 2020-04-15 | Payer: 59 | Source: Ambulatory Visit

## 2020-04-15 DIAGNOSIS — D0512 Intraductal carcinoma in situ of left breast: Secondary | ICD-10-CM | POA: Diagnosis present

## 2020-04-15 DIAGNOSIS — Z510 Encounter for antineoplastic radiation therapy: Secondary | ICD-10-CM | POA: Insufficient documentation

## 2020-04-16 ENCOUNTER — Ambulatory Visit
Admission: RE | Admit: 2020-04-16 | Discharge: 2020-04-16 | Disposition: A | Discharge status: Home / Self Care | Payer: 59 | Source: Ambulatory Visit | Attending: Radiation Oncology | Admitting: Radiation Oncology

## 2020-04-16 DIAGNOSIS — Z510 Encounter for antineoplastic radiation therapy: Secondary | ICD-10-CM | POA: Diagnosis not present

## 2020-04-17 ENCOUNTER — Ambulatory Visit
Admission: RE | Admit: 2020-04-17 | Discharge: 2020-04-17 | Disposition: A | Discharge status: Home / Self Care | Payer: 59 | Source: Ambulatory Visit | Attending: Radiation Oncology | Admitting: Radiation Oncology

## 2020-04-17 DIAGNOSIS — Z510 Encounter for antineoplastic radiation therapy: Secondary | ICD-10-CM | POA: Diagnosis not present

## 2020-04-20 ENCOUNTER — Ambulatory Visit
Admission: RE | Admit: 2020-04-20 | Discharge: 2020-04-20 | Disposition: A | Discharge status: Home / Self Care | Payer: 59 | Source: Ambulatory Visit | Attending: Radiation Oncology | Admitting: Radiation Oncology

## 2020-04-20 DIAGNOSIS — Z510 Encounter for antineoplastic radiation therapy: Secondary | ICD-10-CM | POA: Diagnosis not present

## 2020-04-21 ENCOUNTER — Ambulatory Visit
Admission: RE | Admit: 2020-04-21 | Discharge: 2020-04-21 | Disposition: A | Discharge status: Home / Self Care | Payer: 59 | Source: Ambulatory Visit | Attending: Radiation Oncology | Admitting: Radiation Oncology

## 2020-04-21 DIAGNOSIS — Z510 Encounter for antineoplastic radiation therapy: Secondary | ICD-10-CM | POA: Diagnosis not present

## 2020-04-22 ENCOUNTER — Ambulatory Visit
Admission: RE | Admit: 2020-04-22 | Discharge: 2020-04-22 | Disposition: A | Discharge status: Home / Self Care | Payer: 59 | Source: Ambulatory Visit | Attending: Radiation Oncology | Admitting: Radiation Oncology

## 2020-04-22 DIAGNOSIS — Z510 Encounter for antineoplastic radiation therapy: Secondary | ICD-10-CM | POA: Diagnosis not present

## 2020-04-23 ENCOUNTER — Ambulatory Visit
Admission: RE | Admit: 2020-04-23 | Discharge: 2020-04-23 | Disposition: A | Discharge status: Home / Self Care | Payer: 59 | Source: Ambulatory Visit | Attending: Radiation Oncology | Admitting: Radiation Oncology

## 2020-04-23 DIAGNOSIS — Z510 Encounter for antineoplastic radiation therapy: Secondary | ICD-10-CM | POA: Diagnosis not present

## 2020-04-24 ENCOUNTER — Ambulatory Visit
Admission: RE | Admit: 2020-04-24 | Discharge: 2020-04-24 | Disposition: A | Discharge status: Home / Self Care | Payer: 59 | Source: Ambulatory Visit | Attending: Radiation Oncology | Admitting: Radiation Oncology

## 2020-04-24 DIAGNOSIS — Z510 Encounter for antineoplastic radiation therapy: Secondary | ICD-10-CM | POA: Diagnosis not present

## 2020-04-27 ENCOUNTER — Ambulatory Visit
Admission: RE | Admit: 2020-04-27 | Discharge: 2020-04-27 | Disposition: A | Discharge status: Home / Self Care | Payer: 59 | Source: Ambulatory Visit | Attending: Radiation Oncology | Admitting: Radiation Oncology

## 2020-04-27 DIAGNOSIS — Z510 Encounter for antineoplastic radiation therapy: Secondary | ICD-10-CM | POA: Diagnosis not present

## 2020-04-28 ENCOUNTER — Ambulatory Visit
Admission: RE | Admit: 2020-04-28 | Discharge: 2020-04-28 | Disposition: A | Discharge status: Home / Self Care | Payer: 59 | Source: Ambulatory Visit | Attending: Radiation Oncology | Admitting: Radiation Oncology

## 2020-04-28 DIAGNOSIS — Z510 Encounter for antineoplastic radiation therapy: Secondary | ICD-10-CM | POA: Diagnosis not present

## 2020-04-29 ENCOUNTER — Ambulatory Visit
Admission: RE | Admit: 2020-04-29 | Discharge: 2020-04-29 | Disposition: A | Discharge status: Home / Self Care | Payer: 59 | Source: Ambulatory Visit | Attending: Radiation Oncology | Admitting: Radiation Oncology

## 2020-04-29 ENCOUNTER — Inpatient Hospital Stay: Payer: 59 | Attending: Hematology and Oncology

## 2020-04-29 DIAGNOSIS — F17210 Nicotine dependence, cigarettes, uncomplicated: Secondary | ICD-10-CM | POA: Insufficient documentation

## 2020-04-29 DIAGNOSIS — Z9012 Acquired absence of left breast and nipple: Secondary | ICD-10-CM | POA: Insufficient documentation

## 2020-04-29 DIAGNOSIS — D0512 Intraductal carcinoma in situ of left breast: Secondary | ICD-10-CM | POA: Diagnosis present

## 2020-04-29 DIAGNOSIS — Z510 Encounter for antineoplastic radiation therapy: Secondary | ICD-10-CM | POA: Diagnosis not present

## 2020-04-29 LAB — CBC
HCT: 37.6 % (ref 36.0–46.0)
Hemoglobin: 13.3 g/dL (ref 12.0–15.0)
MCH: 32.8 pg (ref 26.0–34.0)
MCHC: 35.4 g/dL (ref 30.0–36.0)
MCV: 92.8 fL (ref 80.0–100.0)
Platelets: 249 10*3/uL (ref 150–400)
RBC: 4.05 MIL/uL (ref 3.87–5.11)
RDW: 12.1 % (ref 11.5–15.5)
WBC: 12 10*3/uL — ABNORMAL HIGH (ref 4.0–10.5)
nRBC: 0 % (ref 0.0–0.2)

## 2020-04-30 ENCOUNTER — Ambulatory Visit
Admission: RE | Admit: 2020-04-30 | Discharge: 2020-04-30 | Disposition: A | Discharge status: Home / Self Care | Payer: 59 | Source: Ambulatory Visit | Attending: Radiation Oncology | Admitting: Radiation Oncology

## 2020-04-30 DIAGNOSIS — Z510 Encounter for antineoplastic radiation therapy: Secondary | ICD-10-CM | POA: Diagnosis not present

## 2020-05-01 ENCOUNTER — Ambulatory Visit
Admission: RE | Admit: 2020-05-01 | Discharge: 2020-05-01 | Disposition: A | Discharge status: Home / Self Care | Payer: 59 | Source: Ambulatory Visit | Attending: Radiation Oncology | Admitting: Radiation Oncology

## 2020-05-01 DIAGNOSIS — Z510 Encounter for antineoplastic radiation therapy: Secondary | ICD-10-CM | POA: Diagnosis not present

## 2020-05-04 ENCOUNTER — Ambulatory Visit
Admission: RE | Admit: 2020-05-04 | Discharge: 2020-05-04 | Disposition: A | Discharge status: Home / Self Care | Payer: 59 | Source: Ambulatory Visit | Attending: Radiation Oncology | Admitting: Radiation Oncology

## 2020-05-04 DIAGNOSIS — Z510 Encounter for antineoplastic radiation therapy: Secondary | ICD-10-CM | POA: Diagnosis not present

## 2020-05-05 ENCOUNTER — Ambulatory Visit
Admission: RE | Admit: 2020-05-05 | Discharge: 2020-05-05 | Disposition: A | Discharge status: Home / Self Care | Payer: 59 | Source: Ambulatory Visit | Attending: Radiation Oncology | Admitting: Radiation Oncology

## 2020-05-05 DIAGNOSIS — Z510 Encounter for antineoplastic radiation therapy: Secondary | ICD-10-CM | POA: Diagnosis not present

## 2020-05-06 ENCOUNTER — Ambulatory Visit
Admission: RE | Admit: 2020-05-06 | Discharge: 2020-05-06 | Disposition: A | Discharge status: Home / Self Care | Payer: 59 | Source: Ambulatory Visit | Attending: Radiation Oncology | Admitting: Radiation Oncology

## 2020-05-06 DIAGNOSIS — Z510 Encounter for antineoplastic radiation therapy: Secondary | ICD-10-CM | POA: Diagnosis not present

## 2020-05-11 ENCOUNTER — Ambulatory Visit
Admission: RE | Admit: 2020-05-11 | Discharge: 2020-05-11 | Disposition: A | Discharge status: Home / Self Care | Payer: 59 | Source: Ambulatory Visit | Attending: Radiation Oncology | Admitting: Radiation Oncology

## 2020-05-11 DIAGNOSIS — Z510 Encounter for antineoplastic radiation therapy: Secondary | ICD-10-CM | POA: Diagnosis not present

## 2020-05-21 ENCOUNTER — Other Ambulatory Visit: Payer: Self-pay | Admitting: Family Medicine

## 2020-05-21 DIAGNOSIS — F329 Major depressive disorder, single episode, unspecified: Secondary | ICD-10-CM

## 2020-05-21 DIAGNOSIS — F419 Anxiety disorder, unspecified: Secondary | ICD-10-CM

## 2020-05-21 NOTE — Telephone Encounter (Signed)
Medication refill sent in, no indication to f/u in last OV notes.

## 2020-06-15 ENCOUNTER — Ambulatory Visit: Payer: 59 | Admitting: Radiation Oncology

## 2020-06-25 ENCOUNTER — Other Ambulatory Visit: Payer: Self-pay

## 2020-06-25 ENCOUNTER — Encounter: Payer: Self-pay | Admitting: Radiation Oncology

## 2020-06-25 ENCOUNTER — Ambulatory Visit
Admission: RE | Admit: 2020-06-25 | Discharge: 2020-06-25 | Disposition: A | Discharge status: Home / Self Care | Payer: 59 | Source: Ambulatory Visit | Attending: Radiation Oncology | Admitting: Radiation Oncology

## 2020-06-25 VITALS — BP 127/75 | HR 81 | Temp 97.8°F | Resp 16 | Wt 119.6 lb

## 2020-06-25 DIAGNOSIS — Z923 Personal history of irradiation: Secondary | ICD-10-CM | POA: Diagnosis not present

## 2020-06-25 DIAGNOSIS — Z9013 Acquired absence of bilateral breasts and nipples: Secondary | ICD-10-CM | POA: Diagnosis not present

## 2020-06-25 DIAGNOSIS — D0512 Intraductal carcinoma in situ of left breast: Secondary | ICD-10-CM

## 2020-06-25 DIAGNOSIS — Z170 Estrogen receptor positive status [ER+]: Secondary | ICD-10-CM | POA: Insufficient documentation

## 2020-06-25 NOTE — Progress Notes (Signed)
Radiation Oncology Follow up Note  Name: Danielle Bishop   Date:   06/25/2020 MRN:  [id number] DOB: Feb 07, 1971    This 50 y.o. female presents to the clinic today for 1 month follow-up status post ER/PR positive ductal carcinoma site to status post bilateral mastectomies with sacrifice of nipple areolar complex..  She underwent radiation therapy to her left reconstructed breast.  REFERRING PROVIDER: Juline Patch, MD  HPI: Patient is a 50 year old female now about 1 month having completed radiation therapy to her left breast for patient with ER/PR positive ductal carcinoma in situ.  She is seen today in routine follow-up is doing well.  She specifically [place] breast tenderness cough or bone pain.  She has not yet started on antiestrogen therapy.  COMPLICATIONS OF TREATMENT: none  FOLLOW UP COMPLIANCE: keeps appointments   PHYSICAL EXAM:  BP 127/75 (BP Location: Left Arm, Patient Position: Sitting)   Pulse 81   Temp 97.8 F (36.6 C) (Tympanic)   Resp 16   Wt 119 lb 9.6 oz (54.3 kg)   BMI 21.19 kg/m  Patient has bilateral reconstructed breasts excellent cosmetic result with lack of nipple areolar complex.  No dominant mass or nodularity is noted in either breast.  No axillary or supraclavicular adenopathy is appreciated.  Well-developed well-nourished patient in NAD. HEENT reveals PERLA, EOMI, discs not visualized.  Oral cavity is clear. No oral mucosal lesions are identified. Neck is clear without evidence of cervical or supraclavicular adenopathy. Lungs are clear to A&P. Cardiac examination is essentially unremarkable with regular rate and rhythm without murmur rub or thrill. Abdomen is benign with no organomegaly or masses noted. Motor sensory and DTR levels are equal and symmetric in the upper and lower extremities. Cranial nerves II through XII are grossly intact. Proprioception is intact. No peripheral adenopathy or edema is identified. No motor or sensory levels are noted. Crude visual  fields are within normal range.  RADIOLOGY RESULTS: No current films to review  PLAN: Patient is now 1 month out from whole breast radiation to her left breast after breast reconstruction.  She continues to do well with no significant side effects or complaints.  Of asked to see her back in 4 to 5 months for follow-up.  Patient knows to call with any concerns at any time.  I would like to take this opportunity to thank you for allowing me to participate in the care of your patient.Noreene Filbert, MD

## 2020-07-12 IMAGING — MG MM DIGITAL DIAGNOSTIC BILAT W/ TOMO W/ CAD
8 of 14 series · 8 of 40 positions shown · non-contrast
Comparison: Baseline exam

CLINICAL DATA: Patient presents for evaluation of spontaneous LEFT
nipple discharge for 8 months. Patient occasionally sees small brown
spots on her clothing.

EXAM:
DIGITAL DIAGNOSTIC BILATERAL MAMMOGRAM WITH CAD AND TOMO
ULTRASOUND LEFT BREAST

[L MLO synth-2D (1 of 3)]
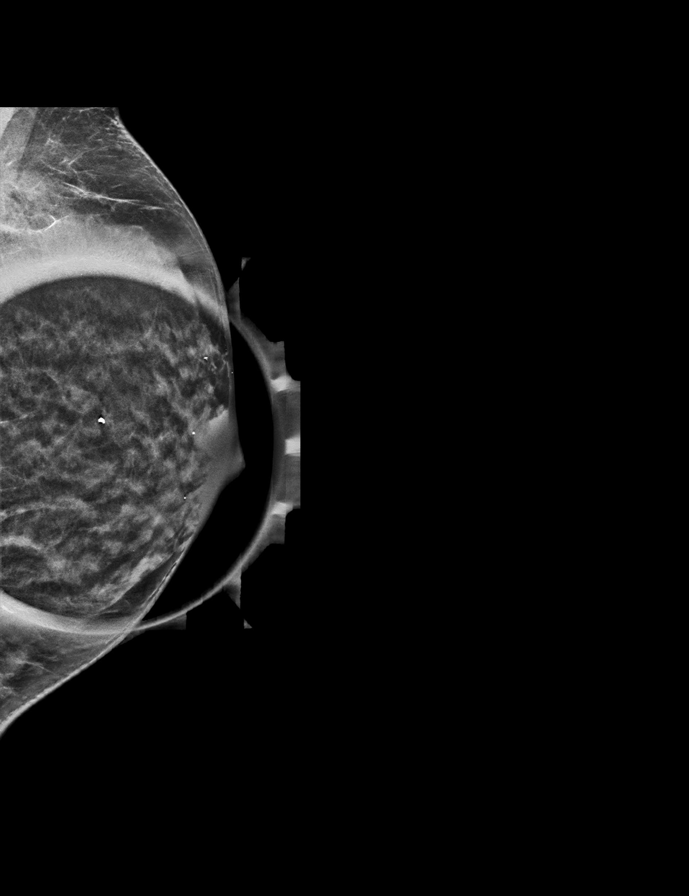

[L MLO synth-2D (2 of 3)]
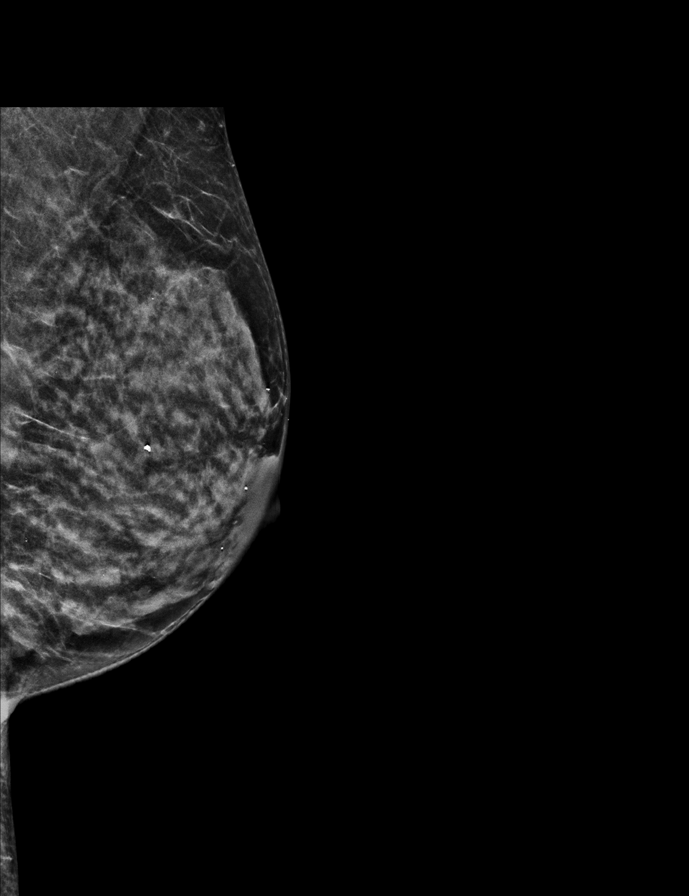

[L MLO synth-2D (3 of 3)]
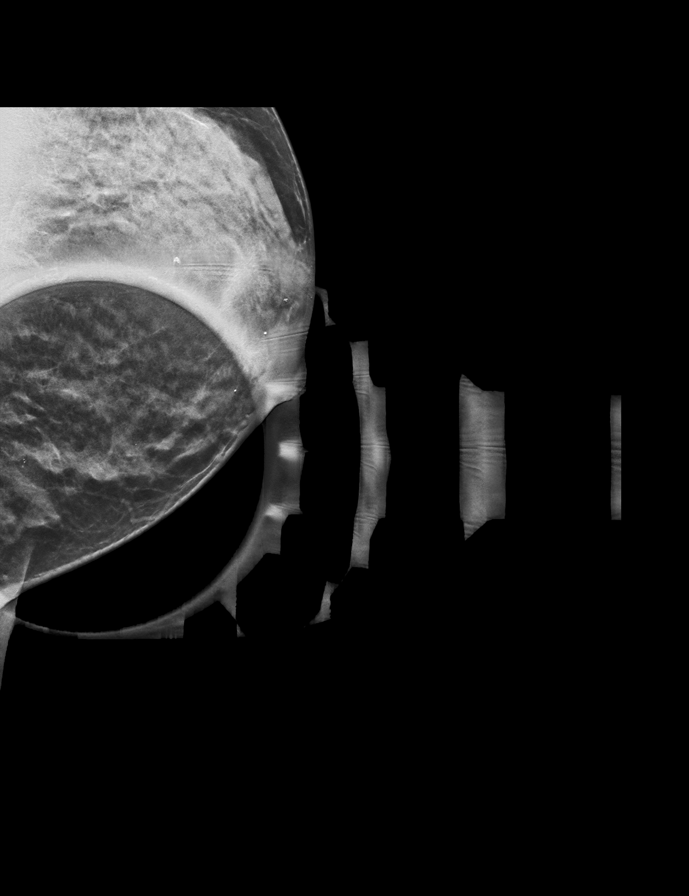

[R MLO synth-2D]
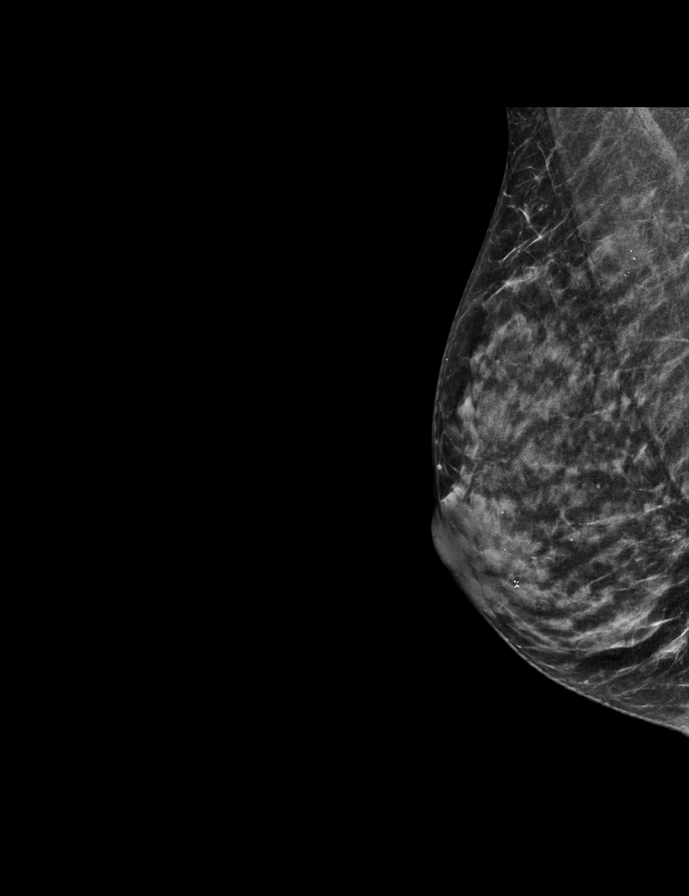

[R CC synth-2D]
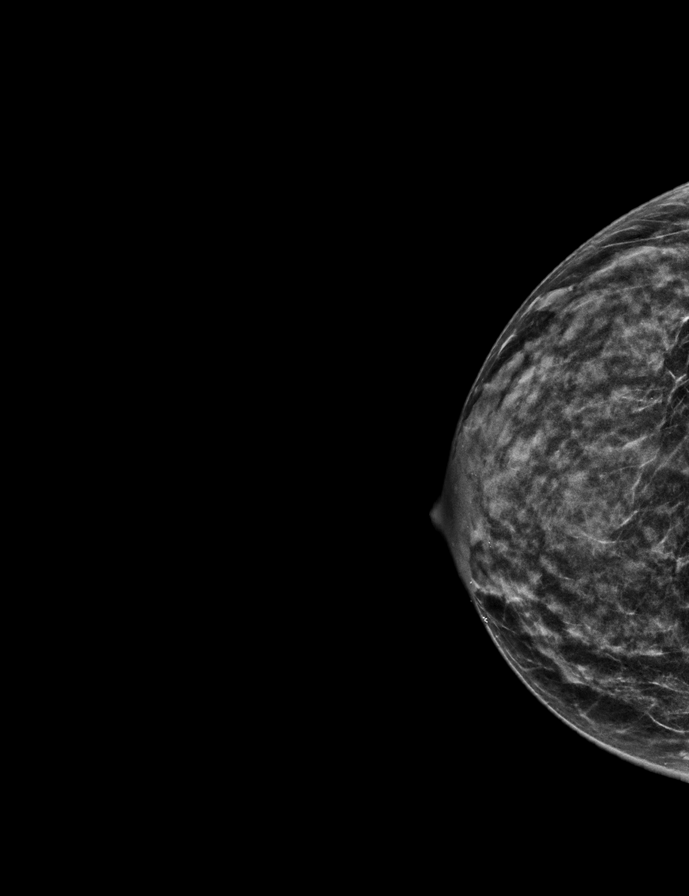

[L CC synth-2D (1 of 2)]
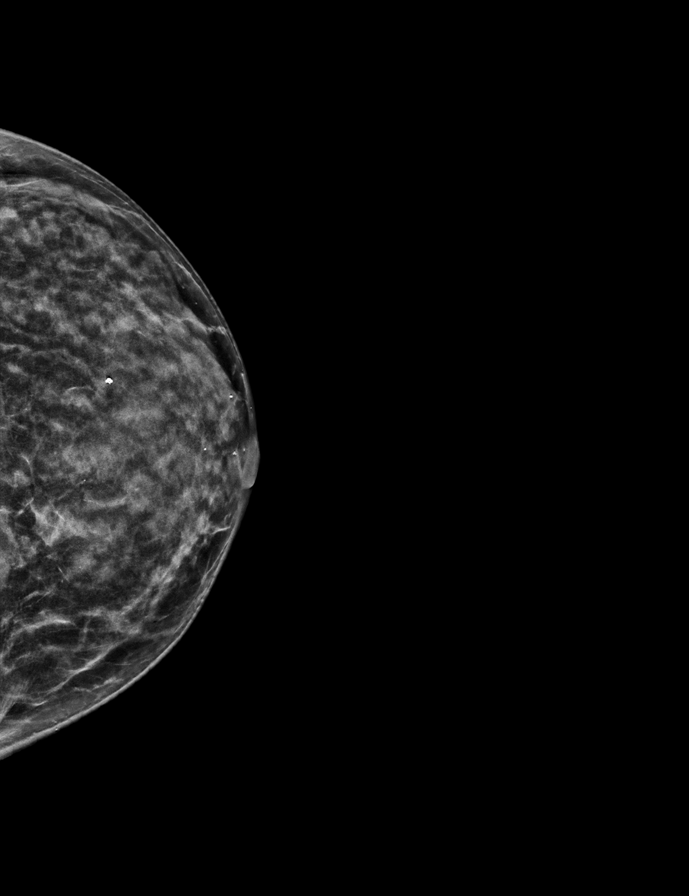

[L CC synth-2D (2 of 2)]
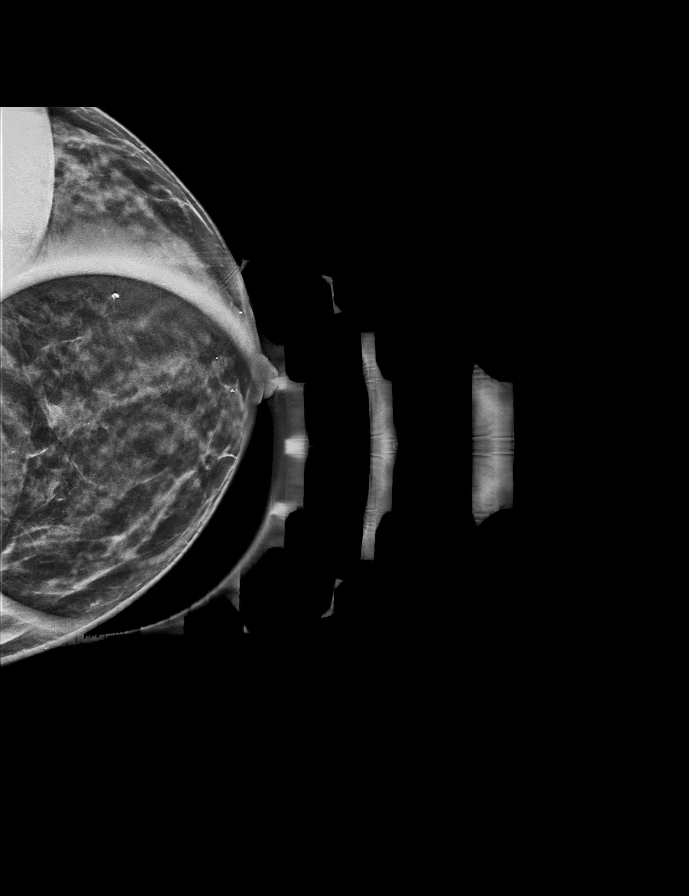

[L CC tomo · tomo slice 25/49.0]
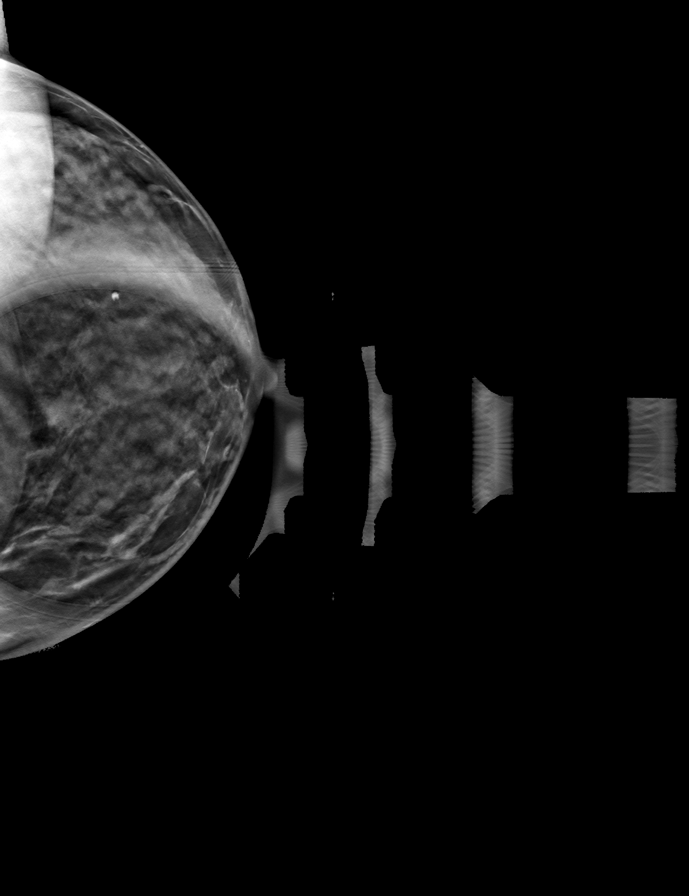

[8 of 40 positions shown; findings below may reference images not displayed]

ACR Breast Density Category d: The breast tissue is extremely dense,
which lowers the sensitivity of mammography.
FINDINGS: RIGHT breast is negative.

Asymmetric density is identified in the LOWER INNER QUADRANT of the
LEFT breast and further evaluated with spot compression views. These
views demonstrate no discrete mass in the LOWER INNER QUADRANT. Spot
compression view of the retroareolar region of the LEFT breast is
unremarkable.

Mammographic images were processed with CAD.

On physical exam, I am able to express a small amount of clear
discharge from the central aspect of the LEFT nipple, a single duct.
I palpate no abnormality in the LOWER INNER QUADRANT of the LEFT
breast.

Targeted ultrasound is performed, showing numerous dilated ducts in
the 6-7 o'clock location of the LEFT breast. No intraductal mass
identified. A solid mass with cystic components is identified in the
7 o'clock location of the LEFT breast 2 centimeters from the nipple
measuring 0.7 x 0.5 x 0.7 centimeters.

Evaluation of the LEFT axilla shows a single lymph node with
thickened cortex. Other lymph nodes have normal morphology.
IMPRESSION: Duct ectasia in the 6-7 o'clock location of the LEFT breast without
identifiable intraductal mass.

Solid mass in the 7 o'clock location of the LEFT breast warranting
tissue diagnosis.

Enlarged LEFT axillary lymph node warranting biopsy.

Single duct spontaneous LEFT nipple discharge warrants further
evaluation.

RECOMMENDATION:
1. Ultrasound-guided core biopsy of mass in the 7 o'clock location
of the LEFT breast.
2. Ultrasound-guided core biopsy of enlarged LEFT axillary lymph
node.
3. Further evaluation with MRI and surgical consultation will be
recommended following biopsies to evaluate single duct spontaneous
discharge.

I have discussed the findings and recommendations with the patient.
If applicable, a reminder letter will be sent to the patient
regarding the next appointment.

BI-RADS CATEGORY  4: Suspicious.

## 2020-07-20 ENCOUNTER — Telehealth: Payer: Self-pay | Admitting: Hematology and Oncology

## 2020-07-20 NOTE — Telephone Encounter (Signed)
07/20/2020 Left vm informing pt that Dr. Loletha Grayer would like to see her for a f/u after her surgery on 07/27/20. appt scheduled for 07/30/20 @ 2:30. Gave call back number in case pt had any questions or issues w/ date/time  SRW

## 2020-07-29 NOTE — Progress Notes (Incomplete)
[facility]  [address], [address] [place], [place] [address] Phone: [phone]  Fax: [phone]   Clinic Day:  07/29/2020  Referring physician: Juline Patch, MD  Chief Complaint: Danielle Bishop is a 50 y.o. 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  HPI: The patient was last seen in the medical oncology clinic on 01/13/2020. At that time, she was doing well status post surgery.  Tumor Board on 01/20/2020 recommended radiation given positive margins and consideration of endocrine therapy.  The patient underwent placement of bilateral breast implants on 02/14/2020 by Dr. Mardelle Matte.  The patient received radiation to the left breast from 04/16/2020 - 05/11/2020. She received 42.56 Gy in 16 fractions.  During the interim, ***   Past Medical History:  Diagnosis Date  . Anxiety   . Breast cancer, left [facility])    dx 09/ 2020 by bx with atypical ductal hyperplasia/ papillnoma;   s/p  lefft breast excsional bx 05-21-2019 ,  dx DCIS  . Family history of breast cancer   . Family history of cancer of tongue   . Family history of skin cancer     Past Surgical History:  Procedure Laterality Date  . BREAST RECONSTRUCTION WITH PLACEMENT OF TISSUE EXPANDER AND FLEX HD (ACELLULAR HYDRATED DERMIS) Bilateral 01/01/2020   Procedure: BREAST RECONSTRUCTION WITH PLACEMENT OF TISSUE EXPANDER AND FLEX HD (ACELLULAR HYDRATED DERMIS);  Surgeon: [name], MD;  Location: [place];  Service: Plastics;  Laterality: Bilateral;  . MASTECTOMY W/ SENTINEL NODE BIOPSY Bilateral 01/01/2020   Procedure: BILATERAL NIPPLE SPARING MASTECTOMY WITH LEFT SENTINEL LYMPH NODE BIOPSY;  Surgeon: Stark Klein, MD;  Location: [place];  Service: General;  Laterality: Bilateral;  PEC BLOCK  . RADIOACTIVE SEED GUIDED EXCISIONAL BREAST BIOPSY Left 05/21/2019   Procedure: LEFT RADIOACTIVE SEED GUIDED EXCISIONAL BREAST BIOPSY;  Surgeon: Stark Klein, MD;  Location: [place];  Service: General;  Laterality: Left;  . RE-EXCISION OF BREAST LUMPECTOMY Left 06/19/2019   Procedure: RE-EXCISION OF LEFT BREAST LUMPECTOMY;  Surgeon: Stark Klein, MD;  Location: [place];  Service: General;  Laterality: Left;  . REMOVAL OF BILATERAL TISSUE EXPANDERS WITH PLACEMENT OF BILATERAL BREAST IMPLANTS Bilateral 02/14/2020   Procedure: REMOVAL OF BILATERAL TISSUE EXPANDERS WITH PLACEMENT OF BILATERAL BREAST IMPLANTS;  Surgeon: [name], MD;  Location: [place];  Service: Plastics;  Laterality: Bilateral;  90 min    Family History  Problem Relation Age of Onset  . Stroke Mother   . Breast cancer Maternal Grandmother 47  . Skin cancer Paternal Grandmother   . Skin cancer Paternal Grandfather   . Breast cancer Other   . Tongue cancer Other   . Cancer Other        unknown type  . Breast cancer Other 89  . Breast cancer Other     Social History:  reports that she has been smoking cigarettes. She has a 10.00 pack-year smoking history. She has never used smokeless tobacco. She reports current alcohol use. She reports that she does not use drugs. She smoked 1/2 pack/day x 20 years. She is now smoking 2 cigarettes per day and some days she does not smoke at all.She occasionally drinks alcohol. Sge denies any exposure to radiation or toxins. 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 The patient is alone*** today.  Allergies: No Known Allergies  Current Medications: Current Outpatient  Medications  Medication Sig Dispense Refill  . ALPRAZolam (XANAX) 0.25 MG tablet Take 1 tablet (0.25 mg total) by mouth 2 (two) times daily as needed for anxiety. (Patient not taking: No sig reported) 20 tablet 1  . ondansetron (ZOFRAN) 4 MG tablet Take 1 tablet (4 mg total) by mouth every 8 (eight) hours as needed for nausea or vomiting. (Patient not taking: No sig reported) 20 tablet 0  .  sertraline (ZOLOFT) 50 MG tablet TAKE 1 TABLET(50 MG) BY MOUTH DAILY 90 tablet 0   No current facility-administered medications for this visit.    Review of Systems  Constitutional: Negative for chills, diaphoresis, fever, malaise/fatigue and weight loss (up 4 lbs).       Doing well.  HENT: Negative for congestion, ear discharge, ear pain, hearing loss, nosebleeds, sinus pain, sore throat and tinnitus.   Eyes: Negative for blurred vision.  Respiratory: Negative for cough, hemoptysis, sputum production and shortness of breath.   Cardiovascular: Negative for chest pain, palpitations and leg swelling.  Gastrointestinal: Negative for abdominal pain, blood in stool, constipation, diarrhea, heartburn, melena, nausea and vomiting.  Genitourinary: Negative for dysuria, flank pain, frequency, hematuria and urgency.  Musculoskeletal: Negative for back pain, joint pain, myalgias and neck pain.  Skin: Negative for itching and rash.  Neurological: Negative for dizziness, tingling, tremors, sensory change, weakness and headaches.  Endo/Heme/Allergies: Does not bruise/bleed easily.  Psychiatric/Behavioral: Negative for depression and memory loss. The patient is not nervous/anxious and does not have insomnia.   All other systems reviewed and are negative.  Performance status (ECOG): 0***  Vitals There were no vitals taken for this visit.   Physical Exam Vitals and nursing note reviewed.  Constitutional:      General: She is not in acute distress.    Appearance: Normal appearance.     Interventions: Face mask in place.  HENT:     Head: Normocephalic and atraumatic.     Comments: Long dark hair.    Mouth/Throat:     Mouth: Mucous membranes are moist.     Pharynx: Oropharynx is clear.  Eyes:     General: No scleral icterus.    Extraocular Movements: Extraocular movements intact.     Conjunctiva/sclera: Conjunctivae normal.     Pupils: Pupils are equal, round, and reactive to light.   Cardiovascular:     Rate and Rhythm: Normal rate and regular rhythm.     Pulses: Normal pulses.     Heart sounds: Normal heart sounds. No murmur heard.   Pulmonary:     Effort: Pulmonary effort is normal. No respiratory distress.     Breath sounds: Normal breath sounds. No wheezing or rales.  Chest:     Chest wall: No tenderness.  Breasts:     Right: No axillary adenopathy or supraclavicular adenopathy.     Left: No axillary adenopathy or supraclavicular adenopathy.      Comments: Ace bandage in place.  S/p bilateral mastectomy with reconstruction. Abdominal:     General: Bowel sounds are normal. There is no distension.     Palpations: Abdomen is soft. There is no mass.     Tenderness: There is no abdominal tenderness. There is no guarding.  Musculoskeletal:        General: No swelling or tenderness. Normal range of motion.     Cervical back: Normal range of motion and neck supple.  Lymphadenopathy:     Head:     Right side of head: No preauricular, posterior auricular or occipital  adenopathy.     Left side of head: No preauricular, posterior auricular or occipital adenopathy.     Cervical: No cervical adenopathy.     Upper Body:     Right upper body: No supraclavicular or axillary adenopathy.     Left upper body: No supraclavicular or axillary adenopathy.     Lower Body: No right inguinal adenopathy. No left inguinal adenopathy.  Skin:    General: Skin is warm and dry.  Neurological:     Mental Status: She is alert and oriented to person, place, and time. Mental status is at baseline.  Psychiatric:        Mood and Affect: Mood normal.        Behavior: Behavior normal.        Thought Content: Thought content normal.        Judgment: Judgment normal.    No visits with results within 3 Day(s) from this visit.  Latest known visit with results is:  Appointment on 04/29/2020  Component Date Value Ref Range Status  . WBC 04/29/2020 12.0* 4.0 - 10.5 K/uL Final  . RBC  04/29/2020 4.05  3.87 - 5.11 MIL/uL Final  . Hemoglobin 04/29/2020 13.3  12.0 - 15.0 g/dL Final  . HCT 04/29/2020 37.6  36.0 - 46.0 % Final  . MCV 04/29/2020 92.8  80.0 - 100.0 fL Final  . MCH 04/29/2020 32.8  26.0 - 34.0 pg Final  . MCHC 04/29/2020 35.4  30.0 - 36.0 g/dL Final  . RDW 04/29/2020 12.1  11.5 - 15.5 % Final  . Platelets 04/29/2020 249  150 - 400 K/uL Final  . nRBC 04/29/2020 0.0  0.0 - 0.2 % Final   Performed at [facility], [address] [place], [place] [address]     Assessment:  Nasiya Pascual is a 50 y.o. female  with left breast DCISs/p bilateral nipple sparing mastectomy and reconstruction on 01/01/2020.  Pathology revealed a 6.5 cm area of grade II DCIS, focally involving the anterior margin and <1 mm from the posterior margin.  One sentinel lymph node was negative.  DCIS was ER+ (95%) and PR+ (100%).  Pathologic stage was pTis (DCIS) pN0.  Right breast pathology was benign.  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  Bilateral mammogram with left axillary ultrasoundon 02/14/2019 revealed duct ectasia in the 6-7 o'clock location of the LEFT breast without identifiable intraductal mass. There was a solid 0.7 x 0.5 x 0.7 cm mass in the 7 o'clock location 2 cm from the nipple in the LEFT breast. There was an enlarged LEFT axillary lymph node. There was a single duct spontaneous LEFT nipple discharge.  Bilateral breast MRI on 07/16/2019 revealed postoperative seroma in the central inferior left breast at the site of patient's lumpectomy for DCIS. Non-mass enhancement was seen surrounding the lumpectomy site involving the entire inferior left breast, spanning 4.0 x 5.8 x 4.3 cm. There was suspicious linear enhancement extending from the postoperative seroma into the nipple. There was indeterminate patchy non-mass enhancement extending into the upper inner  quadrant(UIQ)of the left breast (1.3 x 1.2 cm). There were two indeterminate enhancing masses in the upper inner and lower outer right breast. There was no suspicious lymphadenopathy.  Left breast biopsy on02/12/2021revealedfibrocystic changes in the upper inner quadrant(UIQ)and intermediate grade DCISin thelower outer quadrant (LOQ).DCIS wasER+ (95%)and PR+ (100%).  Right breast biopsyon 02/15/2021revealedatypical lobular hyperplasia, findings suggestive of lipoma,and fibrocystic changes in  lower outer quadrant(LOQ).  She has a family historyof breast cancer (maternal grandmother age 80).  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  Patient received the Moderna COVID-19 vaccine on 08/16/2019 and 09/18/2019.   Symptomatically, ***  Plan: 1. Left breast DCIS Initial pathology revealedgrade II DCIS with focally positive margin. Re-excisionpathology revealed persistent + margins (posterior and inferior). She is s/p bilateral mastectomy and reconstruction on 01/01/2020.  Pathology revealed a 6.5 cm area of grade 2 DCIS focally involving the anterior margin and < 1 mm from the posterior margin.  Discussed likely plan for radiation given positive margin.  Discuss rationale for endocrine therapy for stage 0 breast cancer:   NSABP B-24:  1800 patients randomized between tamoxifen vs placebo in patients s/p BCT and radiation.      At 13.6 years, patients who received tamoxifen had a 3.4% reduction on ipsilateral recurrence and a 3.2% reduction in contralateral breast cancer.      10 year risk of invasive cancer in the ipsilateral breast was 4.6% in the tamoxifen group vs 7.3% placebo group.    10 year risk of 5.6% non-invasive ipsilateral breast cancer was 5.6% in the tamoxifen group vs 7.2% in the placebo group.       10 year risk in the contralateral breast of invasive and non-invasive breast cancer was 4.7% in the tamoxifen group vs 6.9% in the placebo group.    As patient is s/p bilateral mastectomy, discuss limited benefit of adjuvant endocrine therapy.  Present patient at tumor board. 2. Family history of breast cancer Patient 65 with family history of breast cancer in a maternal grandmother at age 8. [employer] results revealed a variant of uncertain significance.   ATM c.7816A>G (p.lle2606Val) and gain (exons 62-63). 3.   Tumor board on 01/20/2020. 4.   MD to call patient after tumor board on 01/20/2020. 5.   RTC in 3 months for MD assessment.  I discussed the assessment and treatment plan with the patient.  The patient was provided an opportunity to ask questions and all were answered.  The patient agreed with the plan and demonstrated an understanding of the instructions.  The patient was advised to call back if the symptoms worsen or if the condition fails to improve as anticipated.  I provided *** minutes of face-to-face time during this this encounter and > 50% was spent counseling as documented under my assessment and plan.  Lequita Asal, MD, PhD    07/29/2020, 12:14 PM  I, Mirian Mo Tufford, am acting as Education administrator for [employer]. Mike Gip, MD, PhD.  I, Melissa C. Mike Gip, MD, have reviewed the above documentation for accuracy and completeness, and I agree with the above.

## 2020-07-30 ENCOUNTER — Inpatient Hospital Stay: Payer: 59 | Admitting: Hematology and Oncology

## 2020-07-30 NOTE — Progress Notes (Signed)
[facility]  [address], [address] [place], [place] [address] Phone: [phone]  Fax: [phone]   Clinic Day:  08/03/2020  Referring physician: Juline Patch, MD  Chief Complaint: Danielle Bishop is a 50 y.o. 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  HPI: The patient was last seen in the medical oncology clinic on 01/13/2020. At that time, she was doing well s/p bilateral mastectomy and reconstruction on 01/01/2020.  Tumor Board on 01/20/2020 recommended radiation given positive margins and consideration of endocrine therapy.  She underwent placement of bilateral breast implants on 02/14/2020 by Dr. Mardelle Matte.  She received radiation to the left breast from 04/16/2020 - 05/11/2020.   During the interim, she has been "good." She has numbness in her left fingertips. She is following up with Dr. Claudia Desanctis and her PCP about this. She denies weakness in her left hand and is not dropping things. She denies any other symptoms or complaints.  She is down to 3 cigarettes daily.   Past Medical History:  Diagnosis Date  . Anxiety   . Breast cancer, left [facility])    dx 09/ 2020 by bx with atypical ductal hyperplasia/ papillnoma;   s/p  lefft breast excsional bx 05-21-2019 ,  dx DCIS  . Family history of breast cancer   . Family history of cancer of tongue   . Family history of skin cancer     Past Surgical History:  Procedure Laterality Date  . BREAST RECONSTRUCTION WITH PLACEMENT OF TISSUE EXPANDER AND FLEX HD (ACELLULAR HYDRATED DERMIS) Bilateral 01/01/2020   Procedure: BREAST RECONSTRUCTION WITH PLACEMENT OF TISSUE EXPANDER AND FLEX HD (ACELLULAR HYDRATED DERMIS);  Surgeon: [name], MD;  Location: [place];  Service: Plastics;  Laterality: Bilateral;  . MASTECTOMY W/ SENTINEL NODE BIOPSY Bilateral 01/01/2020   Procedure: BILATERAL NIPPLE SPARING MASTECTOMY WITH LEFT SENTINEL LYMPH NODE BIOPSY;  Surgeon: Stark Klein, MD;   Location: [place];  Service: General;  Laterality: Bilateral;  PEC BLOCK  . RADIOACTIVE SEED GUIDED EXCISIONAL BREAST BIOPSY Left 05/21/2019   Procedure: LEFT RADIOACTIVE SEED GUIDED EXCISIONAL BREAST BIOPSY;  Surgeon: Stark Klein, MD;  Location: [place];  Service: General;  Laterality: Left;  . RE-EXCISION OF BREAST LUMPECTOMY Left 06/19/2019   Procedure: RE-EXCISION OF LEFT BREAST LUMPECTOMY;  Surgeon: Stark Klein, MD;  Location: [place];  Service: General;  Laterality: Left;  . REMOVAL OF BILATERAL TISSUE EXPANDERS WITH PLACEMENT OF BILATERAL BREAST IMPLANTS Bilateral 02/14/2020   Procedure: REMOVAL OF BILATERAL TISSUE EXPANDERS WITH PLACEMENT OF BILATERAL BREAST IMPLANTS;  Surgeon: [name], MD;  Location: [place];  Service: Plastics;  Laterality: Bilateral;  90 min    Family History  Problem Relation Age of Onset  . Stroke Mother   . Breast cancer Maternal Grandmother 23  . Skin cancer Paternal Grandmother   . Skin cancer Paternal Grandfather   . Breast cancer Other   . Tongue cancer Other   . Cancer Other        unknown type  . Breast cancer Other 89  . Breast cancer Other     Social History:  reports that she has been smoking cigarettes. She has a 10.00 pack-year smoking history. She has never used smokeless tobacco. She reports current alcohol use. She reports that she does not use drugs. She smoked 1/2 pack/day x 20 years. She is now smoking 3 cigarettes per day.She occasionally drinks alcohol.  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 The patient is alone today.  Allergies: No Known Allergies  Current Medications: Current Outpatient Medications  Medication Sig Dispense Refill  . sertraline (ZOLOFT) 50 MG tablet TAKE 1 TABLET(50 MG) BY MOUTH DAILY 90 tablet 0  . ALPRAZolam (XANAX) 0.25 MG tablet Take 1 tablet (0.25 mg total)  by mouth 2 (two) times daily as needed for anxiety. (Patient not taking: No sig reported) 20 tablet 1   No current facility-administered medications for this visit.    Review of Systems  Constitutional: Negative for chills, diaphoresis, fever, malaise/fatigue and weight loss (up 3 lbs).       Feels "good."  HENT: Negative for congestion, ear discharge, ear pain, hearing loss, nosebleeds, sinus pain, sore throat and tinnitus.   Eyes: Negative for blurred vision.  Respiratory: Negative for cough, hemoptysis, sputum production and shortness of breath.   Cardiovascular: Negative for chest pain, palpitations and leg swelling.  Gastrointestinal: Negative for abdominal pain, blood in stool, constipation, diarrhea, heartburn, melena, nausea and vomiting.  Genitourinary: Negative for dysuria, flank pain, frequency, hematuria and urgency.  Musculoskeletal: Negative for back pain, joint pain, myalgias and neck pain.  Skin: Negative for itching and rash.  Neurological: Positive for sensory change (Left hands numbness). Negative for dizziness, tingling, tremors, weakness and headaches.  Endo/Heme/Allergies: Does not bruise/bleed easily.  Psychiatric/Behavioral: Negative for depression and memory loss. The patient is not nervous/anxious and does not have insomnia.   All other systems reviewed and are negative.  Performance status (ECOG): 0  Vitals Blood pressure 122/84, pulse 75, temperature 98.3 F (36.8 C), temperature source Oral, resp. rate 20, height '5\' 3"'  (1.6 m), weight 119 lb 11.4 oz (54.3 kg).   Physical Exam Vitals and nursing note reviewed.  Constitutional:      General: She is not in acute distress.    Appearance: Normal appearance.     Interventions: Face mask in place.  HENT:     Head: Normocephalic and atraumatic.     Comments: Long dark hair.    Mouth/Throat:     Mouth: Mucous membranes are moist.     Pharynx: Oropharynx is clear.  Eyes:     General: No scleral icterus.     Extraocular Movements: Extraocular movements intact.     Conjunctiva/sclera: Conjunctivae normal.     Pupils: Pupils are equal, round, and reactive to light.  Cardiovascular:     Rate and Rhythm: Normal rate and regular rhythm.     Pulses: Normal pulses.     Heart sounds: Normal heart sounds. No murmur heard.   Pulmonary:     Effort: Pulmonary effort is normal. No respiratory distress.     Breath sounds: Normal breath sounds. No wheezing or rales.  Chest:     Chest wall: No tenderness.  Breasts:     Right: No axillary adenopathy or supraclavicular adenopathy.     Left: No axillary adenopathy or supraclavicular adenopathy.      Comments: S/p bilateral mastectomy with reconstruction. Abdominal:     General: Bowel sounds are normal. There is no distension.     Palpations: Abdomen is soft. There is no mass.     Tenderness: There is no abdominal tenderness. There is no guarding.  Musculoskeletal:        General: No swelling or tenderness. Normal range of motion.     Cervical back: Normal range of motion and neck supple.  Lymphadenopathy:     Head:     Right side of head: No preauricular, posterior auricular or occipital adenopathy.     Left side of head: No preauricular, posterior auricular or occipital adenopathy.     Cervical: No cervical adenopathy.     Upper Body:     Right upper body: No supraclavicular or axillary adenopathy.     Left upper body: No supraclavicular or axillary adenopathy.     Lower Body: No right inguinal adenopathy. No left inguinal adenopathy.  Skin:    General: Skin is warm and dry.  Neurological:     Mental Status: She is alert and oriented to person, place, and time. Mental status is at baseline.  Psychiatric:        Mood and Affect: Mood normal.        Behavior: Behavior normal.        Thought Content: Thought content normal.        Judgment: Judgment normal.    No visits with results within 3 Day(s) from this visit.  Latest known visit with  results is:  Appointment on 04/29/2020  Component Date Value Ref Range Status  . WBC 04/29/2020 12.0* 4.0 - 10.5 K/uL Final  . RBC 04/29/2020 4.05  3.87 - 5.11 MIL/uL Final  . Hemoglobin 04/29/2020 13.3  12.0 - 15.0 g/dL Final  . HCT 04/29/2020 37.6  36.0 - 46.0 % Final  . MCV 04/29/2020 92.8  80.0 - 100.0 fL Final  . MCH 04/29/2020 32.8  26.0 - 34.0 pg Final  . MCHC 04/29/2020 35.4  30.0 - 36.0 g/dL Final  . RDW 04/29/2020 12.1  11.5 - 15.5 % Final  . Platelets 04/29/2020 249  150 - 400 K/uL Final  . nRBC 04/29/2020 0.0  0.0 - 0.2 % Final   Performed at [facility], [address], [place], [place] [address]    Assessment:  Danielle Bishop is a 50 y.o. female  with left breast DCISs/p bilateral nipple sparing mastectomy and reconstruction on 01/01/2020.  Pathology revealed a 6.5 cm area of grade II DCIS, focally involving the anterior margin and <1 mm from the posterior margin.  One sentinel lymph node was negative.  DCIS was ER+ (95%) and PR+ (100%).  Pathologic stage was pTis (DCIS) pN0.  Right breast pathology was benign.  She underwent placement of bilateral breast implants on 02/14/2020.  Given her positive margin, she received radiation (42.56 Gy) to the left breast from 04/16/2020 - 05/11/2020.   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  Bilateral mammogram with left axillary ultrasoundon 02/14/2019 revealed duct ectasia in the 6-7 o'clock location of the LEFT breast without identifiable intraductal mass. There was a solid 0.7 x 0.5 x 0.7 cm mass in the 7 o'clock location 2 cm from the nipple in the LEFT breast. There was an enlarged LEFT axillary lymph node. There was a single duct spontaneous LEFT nipple discharge.  Bilateral breast MRI on 07/16/2019 revealed postoperative seroma in the central inferior left breast at the site of patient's lumpectomy for  DCIS. Non-mass enhancement was seen surrounding the lumpectomy site involving the entire inferior left breast, spanning 4.0 x 5.8 x 4.3 cm. There was suspicious linear enhancement extending from the postoperative seroma into the nipple. There was indeterminate patchy non-mass enhancement extending into the upper inner quadrant(UIQ)of the left breast (1.3 x 1.2 cm). There were two indeterminate enhancing masses in the  upper inner and lower outer right breast. There was no suspicious lymphadenopathy.  Left breast biopsy on02/12/2021revealedfibrocystic changes in the upper inner quadrant(UIQ)and intermediate grade DCISin thelower outer quadrant (LOQ).DCIS wasER+ (95%)and PR+ (100%).  Right breast biopsyon 02/15/2021revealedatypical lobular hyperplasia, findings suggestive of lipoma,and fibrocystic changes in lower outer quadrant(LOQ).  She has a family historyof breast cancer (maternal grandmother age 67).  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  Patient received the Moderna COVID-19 vaccine on 08/16/2019 and 09/18/2019.   Symptomatically, she is doing well.  Exam is stable.  Plan: 1. Left breast DCIS Initial pathology revealedgrade II DCIS with focally positive margin. Re-excisionpathology revealed persistent + margins (posterior and inferior). She is s/p bilateral mastectomy and reconstruction on 01/01/2020.  Pathology revealed a 6.5 cm area of grade 2 DCIS focally involving the anterior margin and < 1 mm from the posterior margin.  She received radiation to the left breast from 04/16/2020 - 05/11/2020.    As patient is s/p bilateral mastectomy, discuss limited benefit of adjuvant endocrine therapy.  Tumor board recommended consideration of endocrine therapy.  Symptomatically, she is doing well.   She  feels comfortable with observation alone.  Continue surveillance. 2. Family history of breast cancer Patient 75 with family history of breast cancer in a maternal grandmother at age 48. [employer] results revealed a variant of uncertain significance.   ATM c.7816A>G (p.lle2606Val) and gain (exons 62-63). 3.   RTC in 6 months for MD assess and labs (CBC with diff, CMP).  I discussed the assessment and treatment plan with the patient.  The patient was provided an opportunity to ask questions and all were answered.  The patient agreed with the plan and demonstrated an understanding of the instructions.  The patient was advised to call back if the symptoms worsen or if the condition fails to improve as anticipated.   Lequita Asal, MD, PhD    08/03/2020, 1:45 PM  I, Mirian Mo Tufford, am acting as Education administrator for [employer]. Mike Gip, MD, PhD.  I, Sasha Rueth C. Mike Gip, MD, have reviewed the above documentation for accuracy and completeness, and I agree with the above.

## 2020-08-03 ENCOUNTER — Other Ambulatory Visit: Payer: Self-pay

## 2020-08-03 ENCOUNTER — Inpatient Hospital Stay: Payer: 59 | Attending: Hematology and Oncology | Admitting: Hematology and Oncology

## 2020-08-03 ENCOUNTER — Encounter: Payer: Self-pay | Admitting: Hematology and Oncology

## 2020-08-03 VITALS — BP 122/84 | HR 75 | Temp 98.3°F | Resp 20 | Ht 63.0 in | Wt 119.7 lb

## 2020-08-03 DIAGNOSIS — R200 Anesthesia of skin: Secondary | ICD-10-CM | POA: Diagnosis not present

## 2020-08-03 DIAGNOSIS — D0512 Intraductal carcinoma in situ of left breast: Secondary | ICD-10-CM | POA: Insufficient documentation

## 2020-08-03 DIAGNOSIS — Z170 Estrogen receptor positive status [ER+]: Secondary | ICD-10-CM | POA: Insufficient documentation

## 2020-08-03 DIAGNOSIS — N6042 Mammary duct ectasia of left breast: Secondary | ICD-10-CM | POA: Diagnosis not present

## 2020-08-03 DIAGNOSIS — Z808 Family history of malignant neoplasm of other organs or systems: Secondary | ICD-10-CM | POA: Diagnosis not present

## 2020-08-03 DIAGNOSIS — Z9013 Acquired absence of bilateral breasts and nipples: Secondary | ICD-10-CM | POA: Diagnosis not present

## 2020-08-03 DIAGNOSIS — Z853 Personal history of malignant neoplasm of breast: Secondary | ICD-10-CM | POA: Diagnosis not present

## 2020-08-03 DIAGNOSIS — Z823 Family history of stroke: Secondary | ICD-10-CM | POA: Insufficient documentation

## 2020-08-03 DIAGNOSIS — Z79899 Other long term (current) drug therapy: Secondary | ICD-10-CM | POA: Diagnosis not present

## 2020-08-03 DIAGNOSIS — F17210 Nicotine dependence, cigarettes, uncomplicated: Secondary | ICD-10-CM | POA: Insufficient documentation

## 2020-08-03 DIAGNOSIS — Z809 Family history of malignant neoplasm, unspecified: Secondary | ICD-10-CM | POA: Diagnosis not present

## 2020-08-03 DIAGNOSIS — Z803 Family history of malignant neoplasm of breast: Secondary | ICD-10-CM | POA: Diagnosis not present

## 2020-08-17 ENCOUNTER — Other Ambulatory Visit: Payer: Self-pay | Admitting: Family Medicine

## 2020-08-17 DIAGNOSIS — F329 Major depressive disorder, single episode, unspecified: Secondary | ICD-10-CM

## 2020-08-17 DIAGNOSIS — F419 Anxiety disorder, unspecified: Secondary | ICD-10-CM

## 2020-08-17 NOTE — Progress Notes (Signed)
This encounter was created in error - please disregard.

## 2020-08-17 NOTE — Telephone Encounter (Signed)
Requested medications are due for refill today yes  Requested medications are on the active medication list yes  Last refill 12/9  Future visit scheduled no  Notes to clinic Has already had a curtesy refill and there is no upcoming appointment scheduled.

## 2020-08-26 ENCOUNTER — Encounter: Payer: Self-pay | Admitting: Family Medicine

## 2020-08-26 ENCOUNTER — Ambulatory Visit: Payer: BC Managed Care – PPO | Admitting: Family Medicine

## 2020-08-26 ENCOUNTER — Other Ambulatory Visit: Payer: Self-pay

## 2020-08-26 VITALS — BP 120/70 | HR 80 | Ht 63.0 in | Wt 118.0 lb

## 2020-08-26 DIAGNOSIS — F329 Major depressive disorder, single episode, unspecified: Secondary | ICD-10-CM | POA: Diagnosis not present

## 2020-08-26 DIAGNOSIS — Z124 Encounter for screening for malignant neoplasm of cervix: Secondary | ICD-10-CM

## 2020-08-26 DIAGNOSIS — F419 Anxiety disorder, unspecified: Secondary | ICD-10-CM

## 2020-08-26 MED ORDER — SERTRALINE HCL 50 MG PO TABS: 50.0000 mg | ORAL_TABLET | Freq: Every day | ORAL | 1 refills | Status: DC

## 2020-08-26 NOTE — Progress Notes (Signed)
Date:  08/26/2020   Name:  Danielle Bishop   DOB:  [birth year]   MRN:  [id number]   Chief Complaint: Depression (2 and 1)  Depression        This is a chronic ("evening me out quite a bit") problem.  The current episode started more than 1 year ago.   The onset quality is gradual.   The problem has been gradually improving since onset.  Associated symptoms include no decreased concentration, no fatigue, no helplessness, no hopelessness, does not have insomnia, not irritable, no restlessness, no decreased interest, no appetite change, no body aches, no myalgias, no headaches, no indigestion, not sad and no suicidal ideas.     The symptoms are aggravated by nothing.  Past treatments include SSRIs - Selective serotonin reuptake inhibitors.  Compliance with treatment is variable.  Previous treatment provided moderate relief.   No results found for: CREATININE, BUN, NA, K, CL, CO2 No results found for: CHOL, HDL, LDLCALC, LDLDIRECT, TRIG, CHOLHDL No results found for: TSH No results found for: HGBA1C Lab Results  Component Value Date   WBC 12.0 (H) 04/29/2020   HGB 13.3 04/29/2020   HCT 37.6 04/29/2020   MCV 92.8 04/29/2020   PLT 249 04/29/2020   No results found for: ALT, AST, GGT, ALKPHOS, BILITOT   Review of Systems  Constitutional: Negative.  Negative for appetite change, chills, fatigue, fever and unexpected weight change.  HENT: Negative for congestion, ear discharge, ear pain, rhinorrhea, sinus pressure, sneezing and sore throat.   Eyes: Negative for photophobia, pain, discharge, redness and itching.  Respiratory: Negative for cough, shortness of breath, wheezing and stridor.   Cardiovascular: Negative for chest pain, palpitations and leg swelling.  Gastrointestinal: Negative for abdominal pain, blood in stool, constipation, diarrhea, nausea and vomiting.  Endocrine: Negative for cold intolerance, heat intolerance, polydipsia, polyphagia and polyuria.  Genitourinary: Negative for  dysuria, flank pain, frequency, hematuria, menstrual problem, pelvic pain, urgency, vaginal bleeding and vaginal discharge.  Musculoskeletal: Negative for arthralgias, back pain and myalgias.  Skin: Negative for rash.  Allergic/Immunologic: Negative for environmental allergies and food allergies.  Neurological: Negative for dizziness, weakness, light-headedness, numbness and headaches.  Hematological: Negative for adenopathy. Does not bruise/bleed easily.  Psychiatric/Behavioral: Positive for depression. Negative for decreased concentration, dysphoric mood and suicidal ideas. The patient is not nervous/anxious and does not have insomnia.     Patient Active Problem List   Diagnosis Date Noted  . Breast cancer ([place]) 01/01/2020  . Family history of skin cancer   . Family history of cancer of tongue   . 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 Family history of breast cancer 07/09/2019  . 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    No Known Allergies  Past Surgical History:  Procedure Laterality Date  . BREAST RECONSTRUCTION WITH PLACEMENT OF TISSUE EXPANDER AND FLEX HD (ACELLULAR HYDRATED DERMIS) Bilateral 01/01/2020   Procedure: BREAST RECONSTRUCTION WITH PLACEMENT OF TISSUE EXPANDER AND FLEX HD (ACELLULAR HYDRATED DERMIS);  Surgeon: [name], MD;  Location: [place];  Service: Plastics;  Laterality: Bilateral;  . MASTECTOMY W/ SENTINEL NODE BIOPSY Bilateral 01/01/2020   Procedure: BILATERAL NIPPLE SPARING MASTECTOMY WITH LEFT SENTINEL LYMPH NODE BIOPSY;  Surgeon: Stark Klein, MD;  Location: [place];  Service: General;  Laterality: Bilateral;  PEC BLOCK  . RADIOACTIVE SEED GUIDED EXCISIONAL BREAST BIOPSY Left 05/21/2019   Procedure: LEFT RADIOACTIVE SEED GUIDED EXCISIONAL BREAST BIOPSY;  Surgeon:  Stark Klein, MD;  Location: [place];  Service: General;  Laterality: Left;  . RE-EXCISION OF  BREAST LUMPECTOMY Left 06/19/2019   Procedure: RE-EXCISION OF LEFT BREAST LUMPECTOMY;  Surgeon: Stark Klein, MD;  Location: [place];  Service: General;  Laterality: Left;  . REMOVAL OF BILATERAL TISSUE EXPANDERS WITH PLACEMENT OF BILATERAL BREAST IMPLANTS Bilateral 02/14/2020   Procedure: REMOVAL OF BILATERAL TISSUE EXPANDERS WITH PLACEMENT OF BILATERAL BREAST IMPLANTS;  Surgeon: [name], MD;  Location: [place];  Service: Plastics;  Laterality: Bilateral;  90 min    Social History   Tobacco Use  . Smoking status: Current Every Day Smoker    Packs/day: 0.50    Years: 20.00    Pack years: 10.00    Types: Cigarettes  . Smokeless tobacco: Never Used  Vaping Use  . Vaping Use: Never used  Substance Use Topics  . Alcohol use: Yes    Comment: occas  . Drug use: No     Medication list has been reviewed and updated.  Current Meds  Medication Sig  . sertraline (ZOLOFT) 50 MG tablet TAKE 1 TABLET(50 MG) BY MOUTH DAILY    PHQ 2/9 Scores 08/26/2020 10/18/2019 04/17/2017  PHQ - 2 Score 0 3 0  PHQ- 9 Score 2 7 1     GAD 7 : Generalized Anxiety Score 08/26/2020 10/18/2019  Nervous, Anxious, on Edge 0 3  Control/stop worrying 1 2  Worry too much - different things 0 0  Trouble relaxing 0 2  Restless 0 0  Easily annoyed or irritable 0 0  Afraid - awful might happen 0 0  Total GAD 7 Score 1 7  Anxiety Difficulty Not difficult at all Not difficult at all    BP Readings from Last 3 Encounters:  08/26/20 120/70  08/03/20 122/84  06/25/20 127/75    Physical Exam Vitals and nursing note reviewed.  Constitutional:      General: She is not irritable.    Appearance: She is well-developed.  HENT:     Head: Normocephalic.     Right Ear: Tympanic membrane, ear canal and external ear normal.     Left Ear: Tympanic membrane, ear canal and external ear normal.     Nose: Nose normal. No congestion or rhinorrhea.     Mouth/Throat:     Mouth: Mucous  membranes are moist.  Eyes:     General: Lids are everted, no foreign bodies appreciated. No scleral icterus.       Left eye: No foreign body or hordeolum.     Conjunctiva/sclera: Conjunctivae normal.     Right eye: Right conjunctiva is not injected.     Left eye: Left conjunctiva is not injected.     Pupils: Pupils are equal, round, and reactive to light.  Neck:     Thyroid: No thyromegaly.     Vascular: No JVD.     Trachea: No tracheal deviation.  Cardiovascular:     Rate and Rhythm: Normal rate and regular rhythm.     Heart sounds: Normal heart sounds. No murmur heard. No friction rub. No gallop.   Pulmonary:     Effort: Pulmonary effort is normal. No respiratory distress.     Breath sounds: Normal breath sounds. No stridor. No wheezing, rhonchi or rales.  Chest:     Chest wall: No tenderness.  Abdominal:     General: Bowel sounds are normal.     Palpations: Abdomen is soft. There is no mass.  Tenderness: There is no abdominal tenderness. There is no guarding or rebound.  Musculoskeletal:        General: No tenderness. Normal range of motion.     Cervical back: Normal range of motion and neck supple.  Lymphadenopathy:     Cervical: No cervical adenopathy.  Skin:    General: Skin is warm.     Capillary Refill: Capillary refill takes less than 2 seconds.     Findings: No rash.  Neurological:     Mental Status: She is alert and oriented to person, place, and time.     Cranial Nerves: No cranial nerve deficit.     Deep Tendon Reflexes: Reflexes normal.  Psychiatric:        Mood and Affect: Mood is not anxious or depressed.     Wt Readings from Last 3 Encounters:  08/26/20 118 lb (53.5 kg)  08/03/20 119 lb 11.4 oz (54.3 kg)  06/25/20 119 lb 9.6 oz (54.3 kg)    BP 120/70   Pulse 80   Ht 5\' 3"  (1.6 m)   Wt 118 lb (53.5 kg)   LMP 08/12/2020 (Approximate)   BMI 20.90 kg/m   Assessment and Plan: 1. Reactive depression Chronic.  Controlled.  Stable.  PHQ is 2.   Patient will continue sertraline 50 mg once a day and patient agrees very much to do so. - sertraline (ZOLOFT) 50 MG tablet; Take 1 tablet (50 mg total) by mouth daily.  Dispense: 90 tablet; Refill: 1  2. Anxiety .  Controlled.  Stable.  Gad score is 1.  Patient will continue on sertraline 50 mg once a day. - sertraline (ZOLOFT) 50 MG tablet; Take 1 tablet (50 mg total) by mouth daily.  Dispense: 90 tablet; Refill: 1  3. Cervical cancer screening Discussed and patient is referred to gynecology for routine cancer screening of the cervix. - Ambulatory referral to Gynecology

## 2020-08-28 ENCOUNTER — Telehealth: Payer: Self-pay

## 2020-08-28 NOTE — Telephone Encounter (Signed)
Lee Mont referring for Cervical cancer screening. Called and left voicemail for patient to call back to be scheduled.

## 2020-09-03 NOTE — Telephone Encounter (Signed)
Called and left voicemail for patient to call back to be scheduled. 

## 2020-09-04 NOTE — Telephone Encounter (Signed)
Called and left voicemail for patient to call back to be scheduled. 

## 2020-09-09 ENCOUNTER — Telehealth: Payer: Self-pay

## 2020-09-09 NOTE — Telephone Encounter (Signed)
Dr Ronnald Ramp received a message concerning [facility] not being able to get in touch with the pt to schedule an appt. I called the pt and left message stating she needs to all [facility] and schedule her appt and left the number on the message. [phone]

## 2020-10-16 IMAGING — DX MM BREAST SURGICAL SPECIMEN
1 series · 2 of 2 positions shown · non-contrast
Comparison: Previous exam(s).

CLINICAL DATA: Radioactive seed localization was performed of the
left breast prior to excisional biopsy of ADH involving an
intraductal papilloma.

EXAM:
SPECIMEN RADIOGRAPH OF THE LEFT BREAST

[Series 2: specimen digital x-ray, derived · left · 2 of 2 slices shown]
[im 1/2]
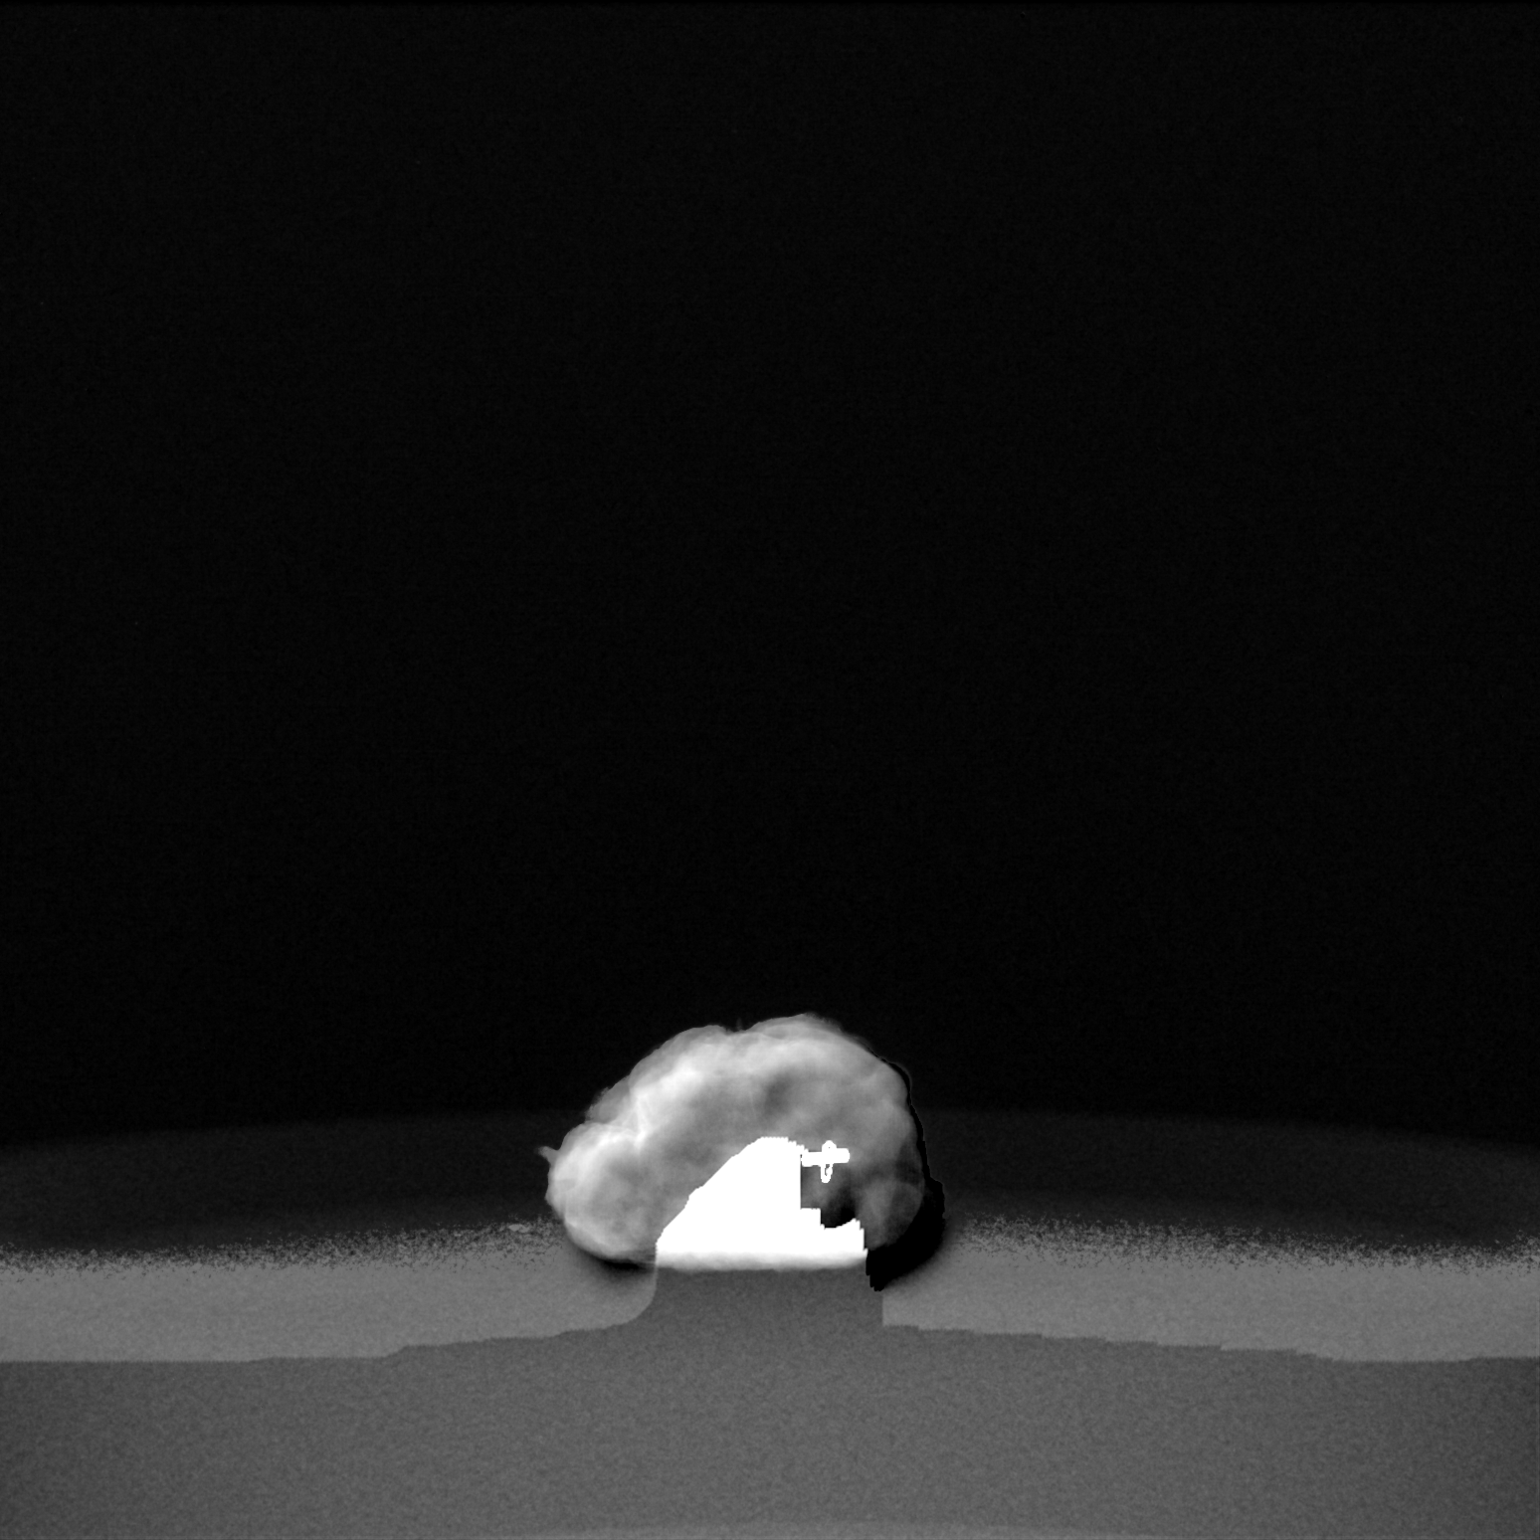
[im 2/2]
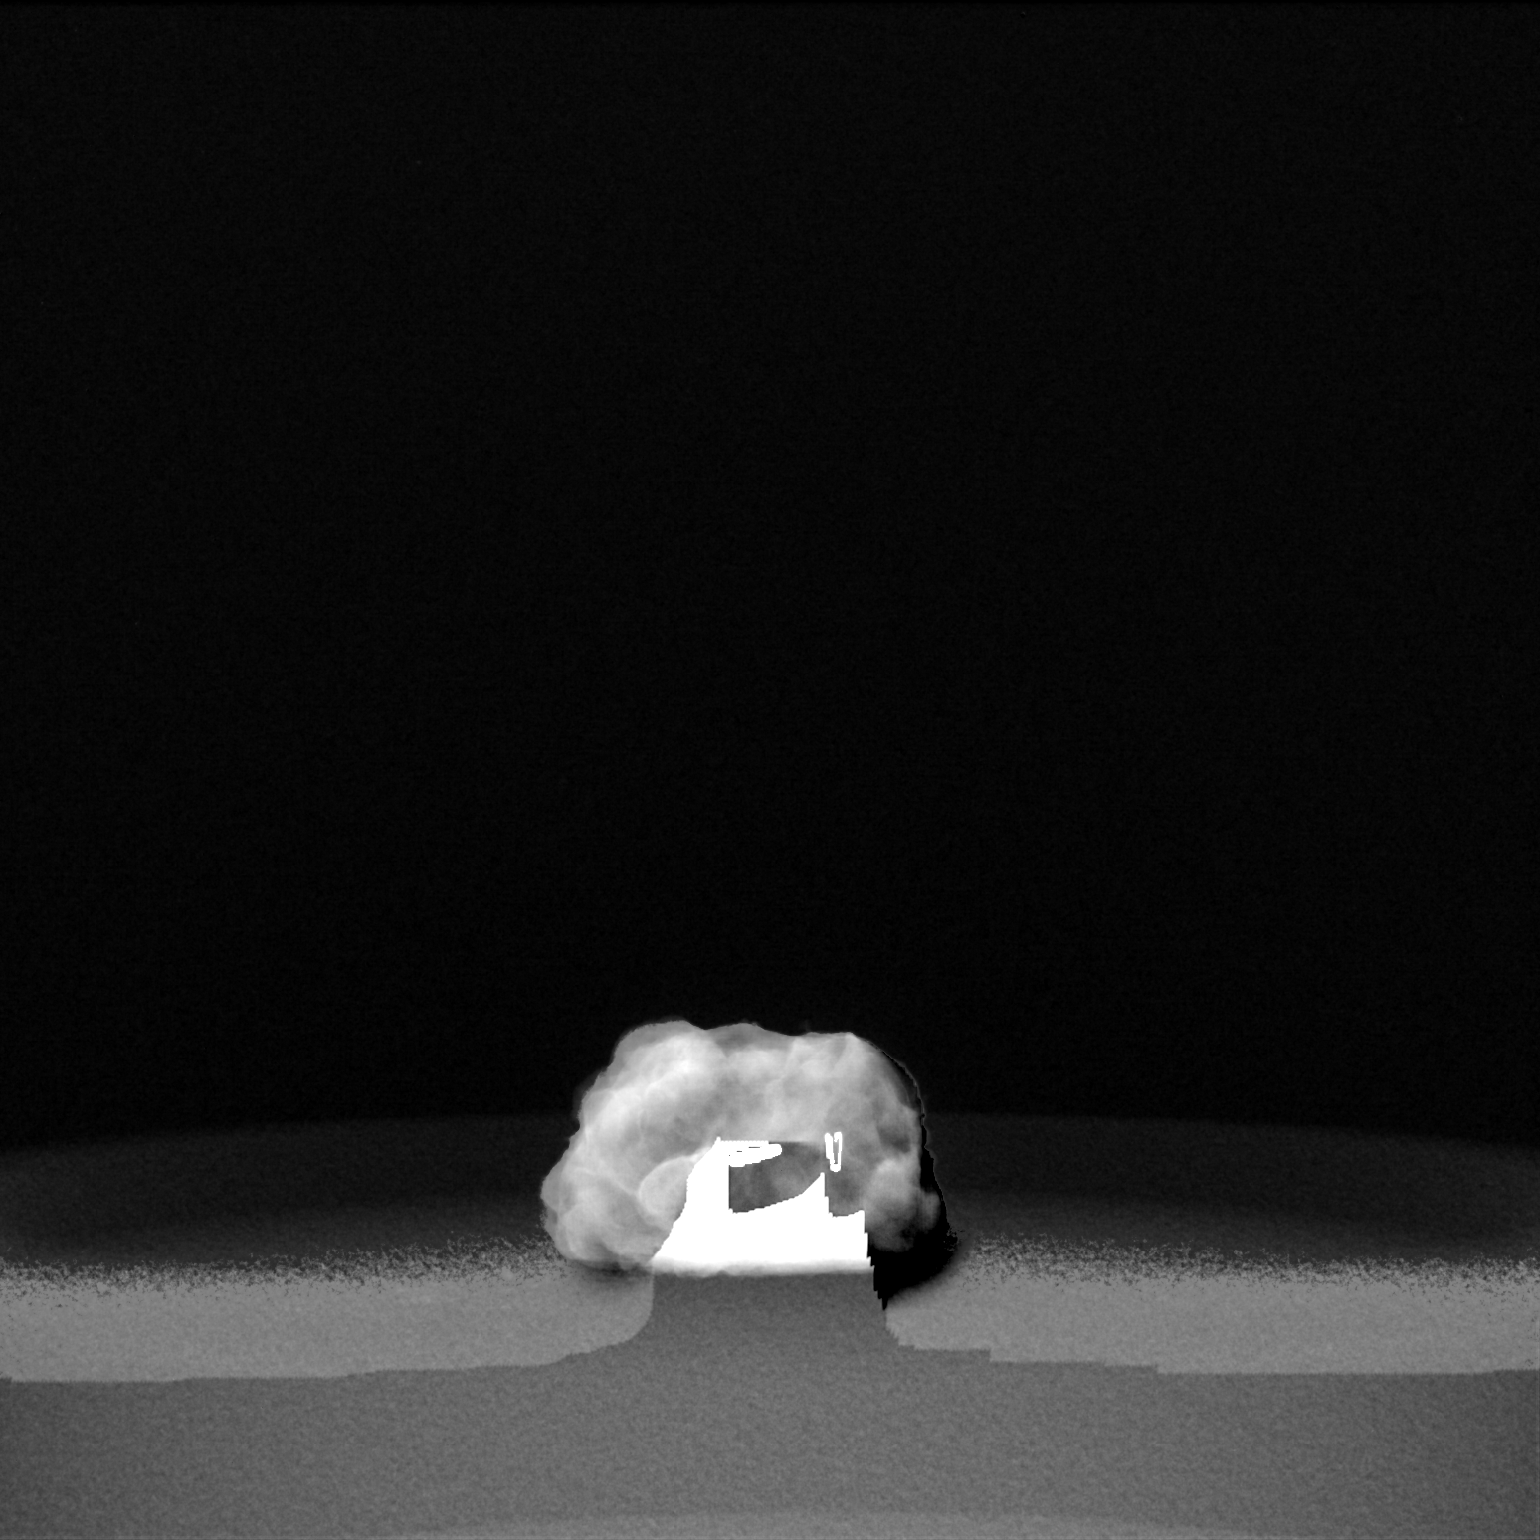

[2 of 2 positions shown; findings below may reference images not displayed]

FINDINGS: Status post excision of the left breast. The radioactive seed and
heart shaped biopsy marker clip are present, completely intact, and
were marked for pathology.
IMPRESSION: Specimen radiograph of the left breast.

## 2020-11-25 ENCOUNTER — Encounter: Payer: Self-pay | Admitting: Radiation Oncology

## 2020-11-25 ENCOUNTER — Ambulatory Visit
Admission: RE | Admit: 2020-11-25 | Discharge: 2020-11-25 | Disposition: A | Discharge status: Home / Self Care | Payer: BC Managed Care – PPO | Source: Ambulatory Visit | Attending: Radiation Oncology | Admitting: Radiation Oncology

## 2020-11-25 VITALS — BP 126/87 | HR 81 | Temp 96.7°F | Resp 18 | Wt 118.3 lb

## 2020-11-25 DIAGNOSIS — Z923 Personal history of irradiation: Secondary | ICD-10-CM | POA: Insufficient documentation

## 2020-11-25 DIAGNOSIS — D0512 Intraductal carcinoma in situ of left breast: Secondary | ICD-10-CM

## 2020-11-25 DIAGNOSIS — Z9013 Acquired absence of bilateral breasts and nipples: Secondary | ICD-10-CM | POA: Insufficient documentation

## 2020-11-25 DIAGNOSIS — Z08 Encounter for follow-up examination after completed treatment for malignant neoplasm: Secondary | ICD-10-CM | POA: Diagnosis not present

## 2020-11-25 DIAGNOSIS — Z86000 Personal history of in-situ neoplasm of breast: Secondary | ICD-10-CM | POA: Diagnosis not present

## 2020-11-25 NOTE — Progress Notes (Signed)
.  Radiation Oncology Follow up Note  Name: Danielle Bishop   Date:   11/25/2020 MRN:  [id number] DOB: 1971-01-08    This 50 y.o. female presents to the clinic today for 72-month follow-up status post bilateral mastectomies with sacrifice of nipple areolar complex and adjuvant radiation therapy to her left breast for patient with a ER/PR positive ductal carcinoma in situ.Marland Kitchen  REFERRING PROVIDER: Juline Patch, MD  HPI: Patient is a 50 year old female now out 6 months having completed whole breast radiation to her left breast as post bilateral mastectomies for ER/PR positive ductal carcinoma in situ.  She is had reconstruction with excellent cosmetic result.  There is no nipple areolar complex.  She specifically denies breast tenderness cough or bone pain.  She was offered antiestrogen therapy although that she has declined that..  She has not had any follow-up imaging at this time. COMPLICATIONS OF TREATMENT: none  FOLLOW UP COMPLIANCE: keeps appointments   PHYSICAL EXAM:  BP 126/87 (BP Location: Left Arm, Patient Position: Sitting)   Pulse 81   Temp (!) 96.7 F (35.9 C) (Tympanic)   Resp 18   Wt 118 lb 4.8 oz (53.7 kg)   BMI 20.96 kg/m  Patient is reconstructed bilateral breast no evidence of mass or nodularity in either breast is noted.  No axillary or supraclavicular adenopathy is appreciated.  Well-developed well-nourished patient in NAD. HEENT reveals PERLA, EOMI, discs not visualized.  Oral cavity is clear. No oral mucosal lesions are identified. Neck is clear without evidence of cervical or supraclavicular adenopathy. Lungs are clear to A&P. Cardiac examination is essentially unremarkable with regular rate and rhythm without murmur rub or thrill. Abdomen is benign with no organomegaly or masses noted. Motor sensory and DTR levels are equal and symmetric in the upper and lower extremities. Cranial nerves II through XII are grossly intact. Proprioception is intact. No peripheral adenopathy or  edema is identified. No motor or sensory levels are noted. Crude visual fields are within normal range.  RADIOLOGY RESULTS: No current films for review  PLAN: Present time patient is doing well no evidence of disease now out 6 months from whole breast radiation and pleased with her overall progress.  I have asked to see her back in 6 months for follow-up.  She is currently being transitioned from Dr. Mike Gip to one of the other medical oncologist.  I will let them be ordering her follow-up mammograms or other imaging studies as they decide.  Patient comprehends my recommendations well.  I would like to take this opportunity to thank you for allowing me to participate in the care of your patient.Noreene Filbert, MD

## 2021-01-31 ENCOUNTER — Other Ambulatory Visit: Payer: Self-pay | Admitting: *Deleted

## 2021-01-31 DIAGNOSIS — D0512 Intraductal carcinoma in situ of left breast: Secondary | ICD-10-CM

## 2021-02-01 ENCOUNTER — Ambulatory Visit: Payer: 59 | Admitting: Hematology and Oncology

## 2021-02-01 ENCOUNTER — Other Ambulatory Visit: Payer: 59

## 2021-02-03 ENCOUNTER — Encounter: Payer: Self-pay | Admitting: Oncology

## 2021-02-03 ENCOUNTER — Inpatient Hospital Stay: Payer: BC Managed Care – PPO | Attending: Oncology

## 2021-02-03 ENCOUNTER — Inpatient Hospital Stay (HOSPITAL_BASED_OUTPATIENT_CLINIC_OR_DEPARTMENT_OTHER): Payer: BC Managed Care – PPO | Admitting: Oncology

## 2021-02-03 ENCOUNTER — Other Ambulatory Visit: Payer: Self-pay

## 2021-02-03 VITALS — BP 114/78 | HR 80 | Temp 97.9°F | Resp 16 | Wt 120.9 lb

## 2021-02-03 DIAGNOSIS — Z86000 Personal history of in-situ neoplasm of breast: Secondary | ICD-10-CM | POA: Insufficient documentation

## 2021-02-03 DIAGNOSIS — Z803 Family history of malignant neoplasm of breast: Secondary | ICD-10-CM | POA: Diagnosis not present

## 2021-02-03 DIAGNOSIS — Z08 Encounter for follow-up examination after completed treatment for malignant neoplasm: Secondary | ICD-10-CM | POA: Diagnosis not present

## 2021-02-03 DIAGNOSIS — D0512 Intraductal carcinoma in situ of left breast: Secondary | ICD-10-CM

## 2021-02-03 DIAGNOSIS — Z9013 Acquired absence of bilateral breasts and nipples: Secondary | ICD-10-CM | POA: Insufficient documentation

## 2021-02-03 DIAGNOSIS — Z79899 Other long term (current) drug therapy: Secondary | ICD-10-CM | POA: Diagnosis not present

## 2021-02-03 LAB — COMPREHENSIVE METABOLIC PANEL
ALT: 11 U/L (ref 0–44)
AST: 17 U/L (ref 15–41)
Albumin: 4.2 g/dL (ref 3.5–5.0)
Alkaline Phosphatase: 45 U/L (ref 38–126)
Anion gap: 6 (ref 5–15)
BUN: 22 mg/dL — ABNORMAL HIGH (ref 6–20)
CO2: 26 mmol/L (ref 22–32)
Calcium: 8.7 mg/dL — ABNORMAL LOW (ref 8.9–10.3)
Chloride: 103 mmol/L (ref 98–111)
Creatinine, Ser: 0.98 mg/dL (ref 0.44–1.00)
GFR, Estimated: >60 mL/min (ref >60)
Glucose, Bld: 100 mg/dL — ABNORMAL HIGH (ref 70–99)
Potassium: 3.9 mmol/L (ref 3.5–5.1)
Sodium: 135 mmol/L (ref 135–145)
Total Bilirubin: 0.6 mg/dL (ref 0.3–1.2)
Total Protein: 7.5 g/dL (ref 6.5–8.1)

## 2021-02-03 LAB — CBC WITH DIFFERENTIAL/PLATELET
Abs Immature Granulocytes: 0.04 10*3/uL (ref 0.00–0.07)
Basophils Absolute: 0.1 10*3/uL (ref 0.0–0.1)
Basophils Relative: 1 %
Eosinophils Absolute: 0.1 10*3/uL (ref 0.0–0.5)
Eosinophils Relative: 1 %
HCT: 37.7 % (ref 36.0–46.0)
Hemoglobin: 13.4 g/dL (ref 12.0–15.0)
Immature Granulocytes: 0 %
Lymphocytes Relative: 17 %
Lymphs Abs: 1.6 10*3/uL (ref 0.7–4.0)
MCH: 32.9 pg (ref 26.0–34.0)
MCHC: 35.5 g/dL (ref 30.0–36.0)
MCV: 92.6 fL (ref 80.0–100.0)
Monocytes Absolute: 0.7 10*3/uL (ref 0.1–1.0)
Monocytes Relative: 8 %
Neutro Abs: 6.6 10*3/uL (ref 1.7–7.7)
Neutrophils Relative %: 73 %
Platelets: 260 10*3/uL (ref 150–400)
RBC: 4.07 MIL/uL (ref 3.87–5.11)
RDW: 12.6 % (ref 11.5–15.5)
WBC: 9.1 10*3/uL (ref 4.0–10.5)
nRBC: 0 % (ref 0.0–0.2)

## 2021-02-03 NOTE — Progress Notes (Signed)
Examined the patient in clinic.   Physical Exam Constitutional:      Appearance: Normal appearance.  HENT:     Head: Normocephalic and atraumatic.  Eyes:     Pupils: Pupils are equal, round, and reactive to light.  Cardiovascular:     Rate and Rhythm: Normal rate and regular rhythm.     Heart sounds: Normal heart sounds. No murmur heard. Pulmonary:     Effort: Pulmonary effort is normal.     Breath sounds: Normal breath sounds. No wheezing.  Chest:     Comments: Bilateral mastectomy with breast reconstruction. Left breast slightly larger/fuller then right. Skin discoloration noted to left axilla.  Abdominal:     General: Bowel sounds are normal. There is no distension.     Palpations: Abdomen is soft.     Tenderness: There is no abdominal tenderness.  Musculoskeletal:        General: Normal range of motion.     Cervical back: Normal range of motion.  Lymphadenopathy:     Upper Body:     Right upper body: No supraclavicular or axillary adenopathy.     Left upper body: No supraclavicular or axillary adenopathy.  Skin:    General: Skin is warm and dry.     Findings: No rash.  Neurological:     Mental Status: She is alert and oriented to person, place, and time.  Psychiatric:        Judgment: Judgment normal.    Faythe Casa, NP 02/03/2021 2:55 PM

## 2021-02-07 NOTE — Progress Notes (Signed)
I connected with Danielle Bishop on 02/07/21 at  2:30 PM EDT by video enabled telemedicine visit and verified that I am speaking with the correct person using two identifiers.   I discussed the limitations, risks, security and privacy concerns of performing an evaluation and management service by telemedicine and the availability of in-person appointments. I also discussed with the patient that there may be a patient responsible charge related to this service. The patient expressed understanding and agreed to proceed.  Other persons participating in the visit and their role in the encounter:  none  Patient's location:  cancer center Provider's location:  home  Chief Complaint: Routine follow-up of left breast DCIS  History of present illness: Patient is a 50 year old female diagnosed with left breast DCIS s/p bilateral nipple sparing mastectomy and reconstruction in July 2021.  Pathology showed 6.5 cm area of grade 2 DCIS focally involving the anterior margin and less than 1 mm from the posterior margin.  This was ER positive.  Sentinel lymph node negative.  Patient underwent placement of bilateral breast implants.  Given her positive margin she also received postmastectomy radiation.  [employer] genetic testing showed variants of uncertain significance.  Risks and benefits of adjuvant endocrine therapy were discussed and patient chose not to proceed with adjuvant endocrine therapy.  Interval history: Patient is presently doing well and denies any specific complaints at this time.  Denies any breast concerns.  She has not Undergone mammograms after bilateral mastectomy   Review of Systems  Constitutional:  Negative for chills, fever, malaise/fatigue and weight loss.  HENT:  Negative for congestion, ear discharge and nosebleeds.   Eyes:  Negative for blurred vision.  Respiratory:  Negative for cough, hemoptysis, sputum production, shortness of breath and wheezing.   Cardiovascular:  Negative for chest  pain, palpitations, orthopnea and claudication.  Gastrointestinal:  Negative for abdominal pain, blood in stool, constipation, diarrhea, heartburn, melena, nausea and vomiting.  Genitourinary:  Negative for dysuria, flank pain, frequency, hematuria and urgency.  Musculoskeletal:  Negative for back pain, joint pain and myalgias.  Skin:  Negative for rash.  Neurological:  Negative for dizziness, tingling, focal weakness, seizures, weakness and headaches.  Endo/Heme/Allergies:  Does not bruise/bleed easily.  Psychiatric/Behavioral:  Negative for depression and suicidal ideas. The patient does not have insomnia.    No Known Allergies  Past Medical History:  Diagnosis Date   Anxiety    Breast cancer, left ([place])    dx 09/ 2020 by bx with atypical ductal hyperplasia/ papillnoma;   s/p  lefft breast excsional bx 05-21-2019 ,  dx DCIS   Family history of breast cancer    Family history of cancer of tongue    Family history of skin cancer     Past Surgical History:  Procedure Laterality Date   BREAST RECONSTRUCTION WITH PLACEMENT OF TISSUE EXPANDER AND FLEX HD (ACELLULAR HYDRATED DERMIS) Bilateral 01/01/2020   Procedure: BREAST RECONSTRUCTION WITH PLACEMENT OF TISSUE EXPANDER AND FLEX HD (ACELLULAR HYDRATED DERMIS);  Surgeon: [name], MD;  Location: [place];  Service: Plastics;  Laterality: Bilateral;   MASTECTOMY W/ SENTINEL NODE BIOPSY Bilateral 01/01/2020   Procedure: BILATERAL NIPPLE SPARING MASTECTOMY WITH LEFT SENTINEL LYMPH NODE BIOPSY;  Surgeon: Stark Klein, MD;  Location: [place];  Service: General;  Laterality: Bilateral;  PEC BLOCK   RADIOACTIVE SEED GUIDED EXCISIONAL BREAST BIOPSY Left 05/21/2019   Procedure: LEFT RADIOACTIVE SEED GUIDED EXCISIONAL BREAST BIOPSY;  Surgeon: Stark Klein, MD;  Location: [facility];  Service: General;  Laterality: Left;   RE-EXCISION OF BREAST LUMPECTOMY Left 06/19/2019   Procedure: RE-EXCISION OF  LEFT BREAST LUMPECTOMY;  Surgeon: Stark Klein, MD;  Location: [place];  Service: General;  Laterality: Left;   REMOVAL OF BILATERAL TISSUE EXPANDERS WITH PLACEMENT OF BILATERAL BREAST IMPLANTS Bilateral 02/14/2020   Procedure: REMOVAL OF BILATERAL TISSUE EXPANDERS WITH PLACEMENT OF BILATERAL BREAST IMPLANTS;  Surgeon: [name], MD;  Location: [place];  Service: Plastics;  Laterality: Bilateral;  90 min    Social History   Socioeconomic History   Marital status: Married    Spouse name: Not on file   Number of children: Not on file   Years of education: Not on file   Highest education level: Not on file  Occupational History   Not on file  Tobacco Use   Smoking status: Every Day    Packs/day: 0.50    Years: 20.00    Pack years: 10.00    Types: Cigarettes   Smokeless tobacco: Never  Vaping Use   Vaping Use: Never used  Substance and Sexual Activity   Alcohol use: Yes    Comment: occas   Drug use: No   Sexual activity: Yes    Birth control/protection: None  Other Topics Concern   Not on file  Social History Narrative   Not on file   Social Determinants of Health   Financial Resource Strain: Not on file  Food Insecurity: Not on file  Transportation Needs: Not on file  Physical Activity: Not on file  Stress: Not on file  Social Connections: Not on file  Intimate Partner Violence: Not on file    Family History  Problem Relation Age of Onset   Stroke Mother    Breast cancer Maternal Grandmother 31   Skin cancer Paternal Grandmother    Skin cancer Paternal Grandfather    Breast cancer Other    Tongue cancer Other    Cancer Other        unknown type   Breast cancer Other 90   Breast cancer Other      Current Outpatient Medications:    sertraline (ZOLOFT) 50 MG tablet, Take 1 tablet (50 mg total) by mouth daily., Disp: 90 tablet, Rfl: 1  No results found.  No images are attached to the encounter.   CMP Latest Ref Rng  & Units 02/03/2021  Glucose 70 - 99 mg/dL 100(H)  BUN 6 - 20 mg/dL 22(H)  Creatinine 0.44 - 1.00 mg/dL 0.98  Sodium 135 - 145 mmol/L 135  Potassium 3.5 - 5.1 mmol/L 3.9  Chloride 98 - 111 mmol/L 103  CO2 22 - 32 mmol/L 26  Calcium 8.9 - 10.3 mg/dL 8.7(L)  Total Protein 6.5 - 8.1 g/dL 7.5  Total Bilirubin 0.3 - 1.2 mg/dL 0.6  Alkaline Phos 38 - 126 U/L 45  AST 15 - 41 U/L 17  ALT 0 - 44 U/L 11   CBC Latest Ref Rng & Units 02/03/2021  WBC 4.0 - 10.5 K/uL 9.1  Hemoglobin 12.0 - 15.0 g/dL 13.4  Hematocrit 36.0 - 46.0 % 37.7  Platelets 150 - 400 K/uL 260     Observation/objective: Appears in no acute distress over video visit today.  Breathing is nonlabored  Assessment and plan: Patient is a 50 year old female with history of left breast DCIS ER positive s/p bilateral nipple sparing mastectomy with reconstruction and adjuvant radiation treatment.  She is not currently on endocrine therapy and  this is a routine follow-up visit  Although patient had positive margins she underwent postmastectomy radiation treatment.  Given that she is s/p bilateral mastectomy I think it would be reasonable to observe her off endocrine therapy at this time since there was no evidence of invasive cancer on her final pathology.  Breast exam was done by NP Rulon Abide today.  Follow-up instructions: I will see her back in 1 year  I discussed the assessment and treatment plan with the patient. The patient was provided an opportunity to ask questions and all were answered. The patient agreed with the plan and demonstrated an understanding of the instructions.   The patient was advised to call back or seek an in-person evaluation if the symptoms worsen or if the condition fails to improve as anticipated.    Visit Diagnosis: 1. Encounter for follow-up surveillance of ductal carcinoma in situ (DCIS) of breast     Dr. Randa Evens, MD, MPH [facility] Tel-  [phone] 02/07/2021 7:12 PM

## 2021-02-16 DIAGNOSIS — Z170 Estrogen receptor positive status [ER+]: Secondary | ICD-10-CM | POA: Diagnosis not present

## 2021-02-16 DIAGNOSIS — C50512 Malignant neoplasm of lower-outer quadrant of left female breast: Secondary | ICD-10-CM | POA: Diagnosis not present

## 2021-02-26 ENCOUNTER — Ambulatory Visit: Payer: BC Managed Care – PPO | Admitting: Family Medicine

## 2021-03-02 ENCOUNTER — Other Ambulatory Visit: Payer: Self-pay

## 2021-03-02 ENCOUNTER — Encounter: Payer: Self-pay | Admitting: Family Medicine

## 2021-03-02 ENCOUNTER — Telehealth: Payer: Self-pay

## 2021-03-02 ENCOUNTER — Ambulatory Visit: Payer: BC Managed Care – PPO | Admitting: Family Medicine

## 2021-03-02 VITALS — BP 108/80 | HR 64 | Ht 63.0 in | Wt 122.0 lb

## 2021-03-02 DIAGNOSIS — Z124 Encounter for screening for malignant neoplasm of cervix: Secondary | ICD-10-CM

## 2021-03-02 DIAGNOSIS — F329 Major depressive disorder, single episode, unspecified: Secondary | ICD-10-CM | POA: Diagnosis not present

## 2021-03-02 DIAGNOSIS — Z23 Encounter for immunization: Secondary | ICD-10-CM | POA: Diagnosis not present

## 2021-03-02 DIAGNOSIS — F419 Anxiety disorder, unspecified: Secondary | ICD-10-CM | POA: Diagnosis not present

## 2021-03-02 MED ORDER — SERTRALINE HCL 50 MG PO TABS: 50.0000 mg | ORAL_TABLET | Freq: Every day | ORAL | 1 refills | Status: DC

## 2021-03-02 NOTE — Telephone Encounter (Signed)
[place] medical referring for Cervical cancer screening. Called and left voicemail for patient to call back to be scheduled.

## 2021-03-02 NOTE — Progress Notes (Signed)
Date:  03/02/2021   Name:  Danielle Bishop   DOB:  09/13/1970   MRN:  [id number]   Chief Complaint: Flu Vaccine and Depression  Depression        This is a chronic problem.  The current episode started more than 1 year ago.   The onset quality is gradual.   The problem occurs rarely.  The problem has been resolved since onset.  Associated symptoms include no decreased concentration, no fatigue, no helplessness, no hopelessness, does not have insomnia, not irritable, no restlessness, no decreased interest, no appetite change, no body aches, no myalgias, no headaches, no indigestion, not sad and no suicidal ideas.     The symptoms are aggravated by work stress.  Past treatments include SSRIs - Selective serotonin reuptake inhibitors.  Compliance with treatment is good.  Previous treatment provided significant relief.  Lab Results  Component Value Date   CREATININE 0.98 02/03/2021   BUN 22 (H) 02/03/2021   NA 135 02/03/2021   K 3.9 02/03/2021   CL 103 02/03/2021   CO2 26 02/03/2021   No results found for: CHOL, HDL, LDLCALC, LDLDIRECT, TRIG, CHOLHDL No results found for: TSH No results found for: HGBA1C Lab Results  Component Value Date   WBC 9.1 02/03/2021   HGB 13.4 02/03/2021   HCT 37.7 02/03/2021   MCV 92.6 02/03/2021   PLT 260 02/03/2021   Lab Results  Component Value Date   ALT 11 02/03/2021   AST 17 02/03/2021   ALKPHOS 45 02/03/2021   BILITOT 0.6 02/03/2021     Review of Systems  Constitutional:  Negative for appetite change and fatigue.  HENT: Negative.    Respiratory: Negative.    Cardiovascular: Negative.   Gastrointestinal: Negative.   Genitourinary: Negative.   Musculoskeletal: Negative.  Negative for myalgias.  Skin: Negative.   Neurological: Negative.  Negative for headaches.  Hematological: Negative.   Psychiatric/Behavioral:  Positive for depression. Negative for decreased concentration and suicidal ideas. The patient does not have insomnia.    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 08/01/2019   Family history of breast cancer 07/09/2019   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    No Known Allergies  Past Surgical History:  Procedure Laterality Date   BREAST RECONSTRUCTION WITH PLACEMENT OF TISSUE EXPANDER AND FLEX HD (ACELLULAR HYDRATED DERMIS) Bilateral 01/01/2020   Procedure: BREAST RECONSTRUCTION WITH PLACEMENT OF TISSUE EXPANDER AND FLEX HD (ACELLULAR HYDRATED DERMIS);  Surgeon: [name], MD;  Location: [place];  Service: Plastics;  Laterality: Bilateral;   MASTECTOMY W/ SENTINEL NODE BIOPSY Bilateral 01/01/2020   Procedure: BILATERAL NIPPLE SPARING MASTECTOMY WITH LEFT SENTINEL LYMPH NODE BIOPSY;  Surgeon: Stark Klein, MD;  Location: [place];  Service: General;  Laterality: Bilateral;  PEC BLOCK   RADIOACTIVE SEED GUIDED EXCISIONAL BREAST BIOPSY Left 05/21/2019   Procedure: LEFT RADIOACTIVE SEED GUIDED EXCISIONAL BREAST BIOPSY;  Surgeon: Stark Klein, MD;  Location: [place];  Service: General;  Laterality: Left;   RE-EXCISION OF BREAST LUMPECTOMY Left 06/19/2019   Procedure: RE-EXCISION OF LEFT BREAST LUMPECTOMY;  Surgeon: Stark Klein, MD;  Location: [place];  Service: General;  Laterality: Left;   REMOVAL OF BILATERAL TISSUE EXPANDERS WITH PLACEMENT OF BILATERAL  BREAST IMPLANTS Bilateral 02/14/2020   Procedure: REMOVAL OF BILATERAL TISSUE EXPANDERS WITH PLACEMENT OF BILATERAL BREAST IMPLANTS;  Surgeon: [name], MD;  Location: [place];  Service: Plastics;  Laterality: Bilateral;  90 min    Social History   Tobacco Use   Smoking status: Every Day    Packs/day: 0.50    Years: 20.00    Pack years: 10.00    Types: Cigarettes   Smokeless  tobacco: Never  Vaping Use   Vaping Use: Never used  Substance Use Topics   Alcohol use: Yes    Comment: occas   Drug use: No     Medication list has been reviewed and updated.  Current Meds  Medication Sig   sertraline (ZOLOFT) 50 MG tablet Take 1 tablet (50 mg total) by mouth daily.    PHQ 2/9 Scores 03/02/2021 08/26/2020 10/18/2019 04/17/2017  PHQ - 2 Score 0 0 3 0  PHQ- 9 Score 0 2 7 1     GAD 7 : Generalized Anxiety Score 03/02/2021 08/26/2020 10/18/2019  Nervous, Anxious, on Edge 0 0 3  Control/stop worrying 0 1 2  Worry too much - different things 0 0 0  Trouble relaxing 0 0 2  Restless 0 0 0  Easily annoyed or irritable 0 0 0  Afraid - awful might happen 0 0 0  Total GAD 7 Score 0 1 7  Anxiety Difficulty - Not difficult at all Not difficult at all    BP Readings from Last 3 Encounters:  02/03/21 114/78  11/25/20 126/87  08/26/20 120/70    Physical Exam Vitals and nursing note reviewed.  Constitutional:      General: She is not irritable.    Appearance: She is well-developed.  HENT:     Head: Normocephalic.     Right Ear: Tympanic membrane and external ear normal. There is no impacted cerumen.     Left Ear: Tympanic membrane and external ear normal. There is no impacted cerumen.     Nose: Nose normal. No congestion or rhinorrhea.     Mouth/Throat:     Pharynx: No oropharyngeal exudate or posterior oropharyngeal erythema.  Eyes:     General: Lids are everted, no foreign bodies appreciated. No scleral icterus.       Left eye: No foreign body or hordeolum.     Conjunctiva/sclera: Conjunctivae normal.     Right eye: Right conjunctiva is not injected.     Left eye: Left conjunctiva is not injected.     Pupils: Pupils are equal, round, and reactive to light.  Neck:     Thyroid: No thyromegaly.     Vascular: No JVD.     Trachea: No tracheal deviation.  Cardiovascular:     Rate and Rhythm: Normal rate and regular rhythm.     Heart sounds: Normal heart sounds. No  murmur heard.   No friction rub. No gallop.  Pulmonary:     Effort: Pulmonary effort is normal. No respiratory distress.     Breath sounds: Normal breath sounds. No wheezing, rhonchi or rales.  Abdominal:     General: Bowel sounds are normal.     Palpations: Abdomen is soft. There is no mass.     Tenderness: There is no abdominal tenderness. There is no right CVA tenderness, left CVA tenderness, guarding or rebound.  Musculoskeletal:        General: No tenderness. Normal range of motion.     Cervical back: Normal range of motion and  neck supple.  Lymphadenopathy:     Cervical: No cervical adenopathy.  Skin:    General: Skin is warm.     Findings: No rash.  Neurological:     Mental Status: She is alert and oriented to person, place, and time.     Cranial Nerves: No cranial nerve deficit.     Deep Tendon Reflexes: Reflexes normal.  Psychiatric:        Mood and Affect: Mood is not anxious or depressed.    Wt Readings from Last 3 Encounters:  03/02/21 122 lb (55.3 kg)  02/03/21 120 lb 14.8 oz (54.9 kg)  11/25/20 118 lb 4.8 oz (53.7 kg)    Ht 5\' 3"  (1.6 m)   Wt 122 lb (55.3 kg)   LMP 02/11/2021 (Approximate)   BMI 21.61 kg/m   Assessment and Plan:  1. Reactive depression Chronic.  Controlled.  Stable.  Gad score 0.  Continue sertraline 50 mg once a day.  Recheck in 6 months. - sertraline (ZOLOFT) 50 MG tablet; Take 1 tablet (50 mg total) by mouth daily.  Dispense: 90 tablet; Refill: 1  2. Anxiety Chronic.  Controlled.  Stable.  Gad score is 0.  Continue sertraline 50 mg once a day. - sertraline (ZOLOFT) 50 MG tablet; Take 1 tablet (50 mg total) by mouth daily.  Dispense: 90 tablet; Refill: 1  3. Cervical cancer screening Discussed with patient and referral made to [facility] for cervical cancer screening. - Ambulatory referral to Obstetrics / Gynecology  4. Need for immunization against influenza Discussed and administered - Flu Vaccine QUAD 57mo+IM (Fluarix,  Fluzone & Alfiuria Quad PF)

## 2021-03-03 NOTE — Telephone Encounter (Signed)
Called and left voicemail for patient to call back to be scheduled. 

## 2021-03-04 NOTE — Telephone Encounter (Signed)
Called and left voicemail for patient to call back to be scheduled. 

## 2021-03-09 NOTE — Telephone Encounter (Signed)
Multiple attempts to reach patient were unsuccessful. Contacted referring provider

## 2021-03-10 ENCOUNTER — Ambulatory Visit: Payer: BC Managed Care – PPO | Admitting: Plastic Surgery

## 2021-05-20 ENCOUNTER — Ambulatory Visit: Payer: BC Managed Care – PPO | Admitting: Family Medicine

## 2021-05-20 ENCOUNTER — Other Ambulatory Visit: Payer: Self-pay

## 2021-05-20 VITALS — BP 120/70 | HR 72 | Ht 63.0 in | Wt 120.0 lb

## 2021-05-20 DIAGNOSIS — L0291 Cutaneous abscess, unspecified: Secondary | ICD-10-CM | POA: Diagnosis not present

## 2021-05-20 MED ORDER — AMOXICILLIN-POT CLAVULANATE 875-125 MG PO TABS: 1.0000 | ORAL_TABLET | Freq: Two times a day (BID) | ORAL | 0 refills | Status: DC

## 2021-05-20 NOTE — Progress Notes (Signed)
Date:  05/20/2021   Name:  Danielle Bishop   DOB:  1970/08/08   MRN:  [id number]   Chief Complaint: Cyst  Patient is a 50 year old female who presents for a evaluation of abscess right shoulder. The patient reports the following problems: inflamed 2 weeks. Health maintenance has been reviewed up to date.     Lab Results  Component Value Date   NA 135 02/03/2021   K 3.9 02/03/2021   CO2 26 02/03/2021   GLUCOSE 100 (H) 02/03/2021   BUN 22 (H) 02/03/2021   CREATININE 0.98 02/03/2021   CALCIUM 8.7 (L) 02/03/2021   GFRNONAA >60 02/03/2021   No results found for: CHOL, HDL, LDLCALC, LDLDIRECT, TRIG, CHOLHDL No results found for: TSH No results found for: HGBA1C Lab Results  Component Value Date   WBC 9.1 02/03/2021   HGB 13.4 02/03/2021   HCT 37.7 02/03/2021   MCV 92.6 02/03/2021   PLT 260 02/03/2021   Lab Results  Component Value Date   ALT 11 02/03/2021   AST 17 02/03/2021   ALKPHOS 45 02/03/2021   BILITOT 0.6 02/03/2021   No results found for: 25OHVITD2, 25OHVITD3, VD25OH   Review of Systems  Constitutional:  Negative for chills and fever.  HENT:  Negative for drooling, ear discharge, ear pain and sore throat.   Respiratory:  Negative for cough, shortness of breath and wheezing.   Cardiovascular:  Negative for chest pain, palpitations and leg swelling.  Gastrointestinal:  Negative for abdominal pain, blood in stool, constipation, diarrhea and nausea.  Endocrine: Negative for polydipsia.  Genitourinary:  Negative for dysuria, frequency, hematuria and urgency.  Musculoskeletal:  Negative for back pain, myalgias and neck pain.  Skin:  Negative for rash.  Allergic/Immunologic: Negative for environmental allergies.  Neurological:  Negative for dizziness and headaches.  Hematological:  Does not bruise/bleed easily.  Psychiatric/Behavioral:  Negative for suicidal ideas. The patient is not nervous/anxious.    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    No Known Allergies  Past Surgical History:  Procedure Laterality Date   BREAST RECONSTRUCTION WITH PLACEMENT OF TISSUE EXPANDER AND FLEX HD (ACELLULAR HYDRATED DERMIS) Bilateral 01/01/2020   Procedure: BREAST RECONSTRUCTION WITH PLACEMENT OF TISSUE EXPANDER AND FLEX HD (ACELLULAR HYDRATED DERMIS);  Surgeon: [name], MD;  Location: [place];  Service: Plastics;  Laterality: Bilateral;   MASTECTOMY W/ SENTINEL NODE BIOPSY Bilateral 01/01/2020   Procedure: BILATERAL NIPPLE SPARING MASTECTOMY WITH LEFT SENTINEL LYMPH NODE BIOPSY;  Surgeon: Stark Klein, MD;  Location: [place];  Service: General;  Laterality: Bilateral;  PEC BLOCK   RADIOACTIVE SEED GUIDED EXCISIONAL BREAST BIOPSY Left 05/21/2019   Procedure: LEFT RADIOACTIVE SEED GUIDED EXCISIONAL BREAST BIOPSY;  Surgeon: Stark Klein, MD;  Location: [place];  Service: General;  Laterality: Left;   RE-EXCISION OF BREAST LUMPECTOMY Left 06/19/2019   Procedure: RE-EXCISION OF LEFT BREAST LUMPECTOMY;  Surgeon: Stark Klein, MD;  Location: [place];  Service: General;  Laterality: Left;   REMOVAL OF BILATERAL TISSUE EXPANDERS WITH PLACEMENT OF BILATERAL BREAST IMPLANTS Bilateral 02/14/2020   Procedure: REMOVAL OF BILATERAL TISSUE EXPANDERS WITH PLACEMENT OF  BILATERAL BREAST IMPLANTS;  Surgeon: [name], MD;  Location: [place];  Service: Plastics;  Laterality: Bilateral;  90 min    Social History   Tobacco Use   Smoking status: Every Day    Packs/day: 0.50    Years: 20.00    Pack years: 10.00    Types: Cigarettes   Smokeless tobacco: Never  Vaping Use   Vaping Use: Never used   Substance Use Topics   Alcohol use: Yes    Comment: occas   Drug use: No     Medication list has been reviewed and updated.  No outpatient medications have been marked as taking for the 05/20/21 encounter (Office Visit) with Juline Patch, MD.    [facility] 2/9 Scores 03/02/2021 08/26/2020 10/18/2019 04/17/2017  PHQ - 2 Score 0 0 3 0  PHQ- 9 Score 0 2 7 1     GAD 7 : Generalized Anxiety Score 03/02/2021 08/26/2020 10/18/2019  Nervous, Anxious, on Edge 0 0 3  Control/stop worrying 0 1 2  Worry too much - different things 0 0 0  Trouble relaxing 0 0 2  Restless 0 0 0  Easily annoyed or irritable 0 0 0  Afraid - awful might happen 0 0 0  Total GAD 7 Score 0 1 7  Anxiety Difficulty - Not difficult at all Not difficult at all    BP Readings from Last 3 Encounters:  05/20/21 120/70  03/02/21 108/80  02/03/21 114/78    Physical Exam Vitals and nursing note reviewed.  HENT:     Right Ear: Tympanic membrane, ear canal and external ear normal. There is no impacted cerumen.     Left Ear: Tympanic membrane, ear canal and external ear normal. There is no impacted cerumen.     Nose: Nose normal. No congestion or rhinorrhea.  Eyes:     Pupils: Pupils are equal, round, and reactive to light.  Pulmonary:     Breath sounds: No wheezing or rhonchi.  Skin:    Findings: Abscess present.     Comments: Erythematous abscess with mild tenderness    Wt Readings from Last 3 Encounters:  05/20/21 120 lb (54.4 kg)  03/02/21 122 lb (55.3 kg)  02/03/21 120 lb 14.8 oz (54.9 kg)    BP 120/70   Pulse 72   Ht 5\' 3"  (1.6 m)   Wt 120 lb (54.4 kg)   BMI 21.26 kg/m   Assessment and Plan:  1. Abscess Chronic.  Sebaceous cyst has been there for couple years but is most recently gotten inflamed and erythematous with tenderness.  There is been no drainage at this time.  We will initiate Augmentin 875 mg twice a day an appointment has been made in 4 days for possible incision and drainage. -  amoxicillin-clavulanate (AUGMENTIN) 875-125 MG tablet; Take 1 tablet by mouth 2 (two) times daily.  Dispense: 20 tablet; Refill: 0 - Ambulatory referral to General Surgery

## 2021-05-21 ENCOUNTER — Ambulatory Visit: Payer: BC Managed Care – PPO | Admitting: Family Medicine

## 2021-05-24 ENCOUNTER — Other Ambulatory Visit: Payer: Self-pay

## 2021-05-24 ENCOUNTER — Ambulatory Visit: Payer: BC Managed Care – PPO | Admitting: Surgery

## 2021-05-24 ENCOUNTER — Encounter: Payer: Self-pay | Admitting: Surgery

## 2021-05-24 VITALS — BP 122/84 | HR 77 | Temp 98.0°F | Ht 63.0 in | Wt 121.2 lb

## 2021-05-24 DIAGNOSIS — L723 Sebaceous cyst: Secondary | ICD-10-CM

## 2021-05-24 NOTE — Patient Instructions (Addendum)
If you have any concerns or questions, please feel free to call our office. See follow up appointment below.   Incision and Drainage, Care After  This sheet gives you information about how to care for yourself after your procedure. Your health care provider may also give you more specific instructions. If you have problems or questions, contact your health care provider. What can I expect after the procedure? After the procedure, it is common to have: Pain or discomfort around the incision site. Blood, fluid, or pus (drainage) from the incision. Redness and firm skin around the incision site. Follow these instructions at home: Medicines Take over-the-counter and prescription medicines only as told by your health care provider. If you were prescribed an antibiotic medicine, use or take it as told by your health care provider. Do not stop using the antibiotic even if you start to feel better. Wound care Follow instructions from your health care provider about how to take care of your wound. Make sure you: Wash your hands with soap and water before and after you change your bandage (dressing). If soap and water are not available, use hand sanitizer. Change your dressing and packing as told by your health care provider. If your dressing is dry or stuck when you try to remove it, moisten or wet the dressing with saline or water so that it can be removed without harming your skin or tissues. If your wound is packed, leave it in place until your health care provider tells you to remove it. To remove the packing, moisten or wet the packing with saline or water so that it can be removed without harming your skin or tissues. Leave stitches (sutures), skin glue, or adhesive strips in place. These skin closures may need to stay in place for 2 weeks or longer. If adhesive strip edges start to loosen and curl up, you may trim the loose edges. Do not remove adhesive strips completely unless your health care  provider tells you to do that. Check your wound every day for signs of infection. Check for: More redness, swelling, or pain. More fluid or blood. Warmth. Pus or a bad smell. If you were sent home with a drain tube in place, follow instructions from your health care provider about: How to empty it. How to care for it at home.  General instructions Rest the affected area. Do not take baths, swim, or use a hot tub until your health care provider approves. Ask your health care provider if you may take showers. You may only be allowed to take sponge baths. Return to your normal activities as told by your health care provider. Ask your health care provider what activities are safe for you. Your health care provider may put you on activity or lifting restrictions. The incision will continue to drain. It is normal to have some clear or slightly bloody drainage. The amount of drainage should lessen each day. Do not apply any creams, ointments, or liquids unless you have been told to by your health care provider. Keep all follow-up visits as told by your health care provider. This is important. Contact a health care provider if: Your cyst or abscess returns. You have a fever or chills. You have more redness, swelling, or pain around your incision. You have more fluid or blood coming from your incision. Your incision feels warm to the touch. You have pus or a bad smell coming from your incision. You have red streaks above or below the incision site. Get  help right away if: You have severe pain or bleeding. You cannot eat or drink without vomiting. You have decreased urine output. You become short of breath. You have chest pain. You cough up blood. The affected area becomes numb or starts to tingle. These symptoms may represent a serious problem that is an emergency. Do not wait to see if the symptoms will go away. Get medical help right away. Call your local emergency services (911 in the  [place] Do not drive yourself to the hospital. Summary After this procedure, it is common to have fluid, blood, or pus coming from the surgery site. Follow all home care instructions. You will be told how to take care of your incision, how to check for infection, and how to take medicines. If you were prescribed an antibiotic medicine, take it as told by your health care provider. Do not stop taking the antibiotic even if you start to feel better. Contact a health care provider if you have increased redness, swelling, or pain around your incision. Get help right away if you have chest pain, you vomit, you cough up blood, or you have shortness of breath. Keep all follow-up visits as told by your health care provider. This is important. This information is not intended to replace advice given to you by your health care provider. Make sure you discuss any questions you have with your health care provider. Document Revised: 04/30/2018 Document Reviewed: 04/30/2018 Elsevier Patient Education  2022 [place].   Epidermoid Cyst An epidermoid cyst, also called an epidermal cyst, is a small lump under your skin. The cyst contains a substance called keratin. Do not try to pop or open the cyst yourself. What are the causes? A blocked hair follicle. A hair that curls and re-enters the skin instead of growing straight out of the skin. A blocked pore. Irritated skin. An injury to the skin. Certain conditions that are passed along from parent to child. Human papillomavirus (HPV). This happens rarely when cysts occur on the bottom of the feet. Long-term sun damage to the skin. What increases the risk? Having acne. Being female. Having an injury to the skin. Being past puberty. Having certain conditions caused by genes (genetic disorder) What are the signs or symptoms? These cysts are usually harmless, but they can get infected. Symptoms of infection may  include: Redness. Inflammation. Tenderness. Warmth. Fever. A bad-smelling substance that drains from the cyst. Pus that drains from the cyst. How is this treated? In many cases, epidermoid cysts go away on their own without treatment. If a cyst becomes infected, treatment may include: Opening and draining the cyst, done by a doctor. After draining, you may need minor surgery to remove the rest of the cyst. Antibiotic medicine. Shots of medicines (steroids) that help to reduce inflammation. Surgery to remove the cyst. Surgery may be done if the cyst: Becomes large. Bothers you. Has a chance of turning into cancer. Do not try to open a cyst yourself. Follow these instructions at home: Medicines Take over-the-counter and prescription medicines as told by your doctor. If you were prescribed an antibiotic medicine, take it as told by your doctor. Do not stop taking it even if you start to feel better. General instructions Keep the area around your cyst clean and dry. Wear loose, dry clothing. Avoid touching your cyst. Check your cyst every day for signs of infection. Check for: Redness, swelling, or pain. Fluid or blood. Warmth. Pus or a bad smell. Keep all follow-up visits. How  is this prevented? Wear clean, dry, clothing. Avoid wearing tight clothing. Keep your skin clean and dry. Take showers or baths every day. Contact a doctor if: Your cyst has symptoms of infection. Your condition does not improve or gets worse. You have a cyst that looks different from other cysts you have had. You have a fever. Get help right away if: Redness spreads from the cyst into the area close by. Summary An epidermoid cyst is a small lump under your skin. If a cyst becomes infected, treatment may include surgery to open and drain the cyst, or to remove it. Take over-the-counter and prescription medicines only as told by your doctor. Contact a doctor if your condition is not improving or is  getting worse. Keep all follow-up visits. This information is not intended to replace advice given to you by your health care provider. Make sure you discuss any questions you have with your health care provider. Document Revised: 09/04/2019 Document Reviewed: 09/04/2019 Elsevier Patient Education  [place].

## 2021-05-24 NOTE — Progress Notes (Signed)
05/24/2021  Reason for Visit: Infected right shoulder sebaceous cyst  Requesting Provider: Otilio Miu, MD  History of Present Illness: Danielle Bishop is a 50 y.o. female presenting for evaluation of an infected right shoulder sebaceous cyst.  She was seen by Dr. Ronnald Ramp on 05/20/2021.  At the time, the patient reports that this has been ongoing for about 2 weeks.  Patient reports that this started as a small pimple size area in the back of her right shoulder and started growing and becoming more tender and red.  Denies any drainage, fevers, chills, chest pain, shortness of breath.  She was started on Augmentin on 12/8 and reports that since starting the antibiotic, the pain has improved and the redness and swelling have improved as well.  Still denies any drainage.  This the first time that this has happened.  Past Medical History: Past Medical History:  Diagnosis Date   Anxiety    Breast cancer, left ([place])    dx 09/ 2020 by bx with atypical ductal hyperplasia/ papillnoma;   s/p  lefft breast excsional bx 05-21-2019 ,  dx DCIS   Family history of breast cancer    Family history of cancer of tongue    Family history of skin cancer      Past Surgical History: Past Surgical History:  Procedure Laterality Date   BREAST RECONSTRUCTION WITH PLACEMENT OF TISSUE EXPANDER AND FLEX HD (ACELLULAR HYDRATED DERMIS) Bilateral 01/01/2020   Procedure: BREAST RECONSTRUCTION WITH PLACEMENT OF TISSUE EXPANDER AND FLEX HD (ACELLULAR HYDRATED DERMIS);  Surgeon: [name], MD;  Location: [place];  Service: Plastics;  Laterality: Bilateral;   MASTECTOMY W/ SENTINEL NODE BIOPSY Bilateral 01/01/2020   Procedure: BILATERAL NIPPLE SPARING MASTECTOMY WITH LEFT SENTINEL LYMPH NODE BIOPSY;  Surgeon: Stark Klein, MD;  Location: [place];  Service: General;  Laterality: Bilateral;  PEC BLOCK   RADIOACTIVE SEED GUIDED EXCISIONAL BREAST BIOPSY Left 05/21/2019   Procedure: LEFT  RADIOACTIVE SEED GUIDED EXCISIONAL BREAST BIOPSY;  Surgeon: Stark Klein, MD;  Location: [place];  Service: General;  Laterality: Left;   RE-EXCISION OF BREAST LUMPECTOMY Left 06/19/2019   Procedure: RE-EXCISION OF LEFT BREAST LUMPECTOMY;  Surgeon: Stark Klein, MD;  Location: [place];  Service: General;  Laterality: Left;   REMOVAL OF BILATERAL TISSUE EXPANDERS WITH PLACEMENT OF BILATERAL BREAST IMPLANTS Bilateral 02/14/2020   Procedure: REMOVAL OF BILATERAL TISSUE EXPANDERS WITH PLACEMENT OF BILATERAL BREAST IMPLANTS;  Surgeon: [name], MD;  Location: [place];  Service: Plastics;  Laterality: Bilateral;  90 min    Home Medications: Prior to Admission medications   Medication Sig Start Date End Date Taking? Authorizing Provider  amoxicillin-clavulanate (AUGMENTIN) 875-125 MG tablet Take 1 tablet by mouth 2 (two) times daily. 05/20/21  Yes Juline Patch, MD  sertraline (ZOLOFT) 50 MG tablet Take 1 tablet (50 mg total) by mouth daily. 03/02/21  Yes Juline Patch, MD    Allergies: No Known Allergies  Social History:  reports that she has been smoking cigarettes. She has a 10.00 pack-year smoking history. She has never used smokeless tobacco. She reports current alcohol use. She reports that she does not use drugs.   Family History: Family History  Problem Relation Age of Onset   Stroke Mother    Breast cancer Maternal Grandmother 29   Skin cancer Paternal Grandmother    Skin cancer Paternal Grandfather    Breast cancer Other    Tongue cancer Other  Cancer Other        unknown type   Breast cancer Other 90   Breast cancer Other     Review of Systems: Review of Systems  Constitutional:  Negative for chills and fever.  Respiratory:  Negative for shortness of breath.   Cardiovascular:  Negative for chest pain.  Gastrointestinal:  Negative for nausea and vomiting.  Skin:  Positive for itching (at the cyst location).    Physical Exam BP 122/84   Pulse 77   Temp 98 F (36.7 C) (Oral)   Ht 5\' 3"  (1.6 m)   Wt 121 lb 3.2 oz (55 kg)   LMP 05/14/2021   SpO2 97%   BMI 21.47 kg/m  CONSTITUTIONAL: No acute distress, well-nourished HEENT:  Normocephalic, atraumatic, extraocular motion intact. RESPIRATORY:  Normal respiratory effort without pathologic use of accessory muscles. CARDIOVASCULAR: Regular rhythm and rate. MUSCULOSKELETAL: Normal gait, no peripheral edema. SKIN: The patient has a 2.5 cm area in the posterior right shoulder consistent with an infected sebaceous cyst with some erythema and fluctuance on palpation.  No active drainage at this point.  No ulceration of the skin.  Some mild tenderness to palpation.  NEUROLOGIC:  Motor and sensation is grossly normal.  Cranial nerves are grossly intact. PSYCH:  Alert and oriented to person, place and time. Affect is normal.  Laboratory Analysis: No results found for this or any previous visit (from the past 24 hour(s)).  Imaging: No results found.  Assessment and Plan: This is a 50 y.o. female with an infected sebaceous cyst of the posterior right shoulder.  - It appears that the antibiotics are helping with the infection as the swelling and tenderness have improved.  Discussed with the patient due to potential options for treatment going forwards.  The first option would involve doing incision and drainage procedure today to continue treating the infection and allow for the purulent fluid to be drained out so that this can heal faster.  This will be followed in the near future with formal cyst excision so this episode does not recur.  The other option would be trying to see how the antibiotics currently continue to help given that they have improved the area over the weekend and if things get worse proceeding with incision and drainage procedure but if things continue to improve, just scheduling her in the future for formal excision of the cyst.   Discussed with her that unfortunately could not guarantee her that the antibiotics alone will be able to treat the infection and she may still need an I&D procedure if we follow the second option.  The patient reports that given the tenderness in this area and she has a very physical demanding job, she would rather proceed with I&D now rather than wait. - Discussed with her then the plan for I&D of the posterior right shoulder infected sebaceous cyst and reviewed the surgery at length with her including the risks of bleeding, injury to surrounding structures, need for further procedures, daily packing dressing changes, and she is willing to proceed.   Procedure Date:  05/24/2021  Pre-operative Diagnosis: Infected posterior right shoulder sebaceous cyst  Post-operative Diagnosis: Infected posterior right shoulder sebaceous cyst  Procedure:  Incision and Drainage of infected posterior right shoulder sebaceous cyst  Surgeon:  Melvyn Neth, MD  Anesthesia: 5 mL of 1% lidocaine with epi  Estimated Blood Loss: 2 ml  Specimens: Culture swab  Complications: None  Indications for Procedure:  This is a 50 y.o.  female with diagnosis of infected posterior right shoulder sebaceous cyst, requiring drainage procedure.  The risks of bleeding, abscess or infection, injury to surrounding structures, and need for further procedures were all discussed with the patient and was willing to proceed.  Description of Procedure: The patient was correctly identified at bedside.  Appropriate time-outs were performed prior to procedure.  The patient's posterior right shoulder was prepped and draped in usual sterile fashion.  Local anesthetic was infused intradermally.  A 1 cm incision was made over the abscess, revealing purulent fluid.  This fluid was swabbed for culture and sent to micro.  Small Kelly forceps were used to dissect around the abscess tissue to open any remaining pockets of purulent fluid.  After  drainage was completed, the cavity was irrigated and cleaned.  The wound was packed with 1/4 inch iodoform gauze and covered with dry gauze and tape.  The patient tolerated the procedure well and all sharps were appropriately disposed of at the end of the case.  - Instructed the patient how to do daily packing dressing changes. - Patient may shower. - Patient may take Tylenol or ibuprofen for pain control. - Instructed to continue her antibiotics and I will call her if the culture show any resistance to Augmentin. - Follow-up next week for wound check.   I spent 40 minutes dedicated to the care of this patient on the date of this encounter to include pre-visit review of records, face-to-face time with the patient discussing diagnosis and management, and any post-visit coordination of care.   Melvyn Neth, [facility]

## 2021-05-31 ENCOUNTER — Ambulatory Visit: Payer: BC Managed Care – PPO | Admitting: Radiation Oncology

## 2021-06-01 LAB — ANAEROBIC AND AEROBIC CULTURE: Result 2: NEGATIVE — AB

## 2021-06-02 ENCOUNTER — Ambulatory Visit (INDEPENDENT_AMBULATORY_CARE_PROVIDER_SITE_OTHER): Payer: BC Managed Care – PPO | Admitting: Surgery

## 2021-06-02 ENCOUNTER — Encounter: Payer: Self-pay | Admitting: Surgery

## 2021-06-02 ENCOUNTER — Other Ambulatory Visit: Payer: Self-pay

## 2021-06-02 VITALS — BP 116/76 | HR 75 | Temp 98.7°F | Ht 63.0 in | Wt 120.0 lb

## 2021-06-02 DIAGNOSIS — Z09 Encounter for follow-up examination after completed treatment for conditions other than malignant neoplasm: Secondary | ICD-10-CM

## 2021-06-02 DIAGNOSIS — L723 Sebaceous cyst: Secondary | ICD-10-CM

## 2021-06-02 NOTE — Progress Notes (Signed)
06/02/2021  HPI: Danielle Bishop is a 50 y.o. female s/p I&D of infected right shoulder sebaceous cyst on 05/24/2021.  Patient presents for follow-up.  She reports that she is doing very well denies any further pain.  She has been doing packing dressing changes as instructed.  She has completed her antibiotics.  Vital signs: BP 116/76    Pulse 75    Temp 98.7 F (37.1 C)    Ht 5\' 3"  (1.6 m)    Wt 120 lb (54.4 kg)    LMP 05/14/2021    SpO2 99%    BMI 21.26 kg/m    Physical Exam: Constitutional: No acute distress Skin: Right posterior shoulder I&D site is healing well with the wound getting smaller.  The wound bed is healthy with good granulation tissue.  Dressed with gauze packing and 4 x 4 gauze on the outside.  Assessment/Plan: This is a 49 y.o. female s/p I&D of infected right shoulder sebaceous cyst.  - Discussed with patient that her husband has been packing the wound well but perhaps a little too deeply.  When removing the gauze I could see that the wound cavity had been tight.  Discussed with her that now that the wound is healing well, the goal is to only have enough gauze to put a little bit into the wound to keep the wound edges apart so that the cavity can continue draining and healing from the inside out.  No further antibiotics are needed at this point. - Patient will follow-up in about a month for a formal excision of the cyst to prevent further recurrences.   Melvyn Neth, [facility]

## 2021-06-02 NOTE — Patient Instructions (Addendum)
Pack just enough to fill the area, do not overfill. Cover with a dry gauze. Continue to do this until the gauze will not stay in the wound, then just a dry gauze covering.   We will have you follow up here in 1 month to have the area formally excised.

## 2021-07-05 ENCOUNTER — Encounter: Payer: Self-pay | Admitting: Surgery

## 2021-07-05 ENCOUNTER — Other Ambulatory Visit: Payer: Self-pay | Admitting: Surgery

## 2021-07-05 ENCOUNTER — Ambulatory Visit: Payer: BC Managed Care – PPO | Admitting: Surgery

## 2021-07-05 ENCOUNTER — Other Ambulatory Visit: Payer: Self-pay

## 2021-07-05 VITALS — BP 123/77 | HR 86 | Temp 98.7°F | Ht 63.0 in | Wt 121.6 lb

## 2021-07-05 DIAGNOSIS — L723 Sebaceous cyst: Secondary | ICD-10-CM | POA: Diagnosis not present

## 2021-07-05 DIAGNOSIS — L720 Epidermal cyst: Secondary | ICD-10-CM | POA: Diagnosis not present

## 2021-07-05 NOTE — Patient Instructions (Addendum)
You may take Tylenol and Ibuprofen for pain.  If you have any concerns or questions, please feel free to call our office. See follow up appointment below.   Excision of Skin Lesions, Care After The following information offers guidance on how to care for yourself after your procedure. Your health care provider may also give you more specific instructions. If you have problems or questions, contact your health care provider. What can I expect after the procedure? After your procedure, it is common to have: Soreness or mild pain. Some redness and swelling. Follow these instructions at home: Excision site care  Follow instructions from your health care provider about how to take care of your excision site. Make sure you: Wash your hands with soap and water for at least 20 seconds before and after you change your bandage (dressing). If soap and water are not available, use hand sanitizer. Change your dressing as told by your health care provider. Leave stitches (sutures), skin glue, or adhesive strips in place. These skin closures may need to stay in place for 2 weeks or longer. If adhesive strip edges start to loosen and curl up, you may trim the loose edges. Do not remove adhesive strips completely unless your health care provider tells you to do that. Check the excision area every day for signs of infection. Watch for: More redness, swelling, or pain. Fluid or blood. Warmth. Pus or a bad smell. Keep the site clean, dry, and protected for at least 48 hours. For bleeding, apply gentle but firm pressure to the area using a folded towel for 20 minutes. Do not take baths, swim, or use a hot tub until your health care provider approves. Ask your health care provider if you may take showers. You may only be allowed to take sponge baths. General instructions Take over-the-counter and prescription medicines only as told by your health care provider. Follow instructions from your health care provider  about how to minimize scarring. Scarring should lessen over time. Avoid sun exposure until the area has healed. Use sunscreen to protect the area from the sun after it has healed. Avoid high-impact exercise and activities until the sutures are removed or the area heals. Keep all follow-up visits. This is important. Contact a health care provider if: You have more redness, swelling, or pain around your excision site. You have fluid or blood coming from your excision site. Your excision site feels warm to the touch. You have pus or a bad smell coming from your excision site. You have a fever. You have pain that does not improve in 2-3 days after your procedure. Get help right away if: You have bleeding that does not stop with pressure or a dressing. Your wound opens up. Summary Take over-the-counter and prescription medicines only as told by your health care provider. Change your dressing as told by your health care provider. Contact a health care provider if you have redness, swelling, pain, or other signs of infection around your excision site. Keep all follow-up visits. This is important. This information is not intended to replace advice given to you by your health care provider. Make sure you discuss any questions you have with your health care provider. Document Revised: 12/29/2020 Document Reviewed: 12/29/2020 Elsevier Patient Education  [place].

## 2021-07-05 NOTE — Progress Notes (Signed)
°  Procedure Date:  07/05/2021  Pre-operative Diagnosis:  Right posterior shoulder sebaceous cyst  Post-operative Diagnosis:   Right posterior shoulder sebaceous cyst, 1.8 cm size  Procedure:  Excision of right posterior shoulder sebaceous cyst  Surgeon:  Melvyn Neth, MD  Anesthesia:  General endotracheal  Estimated Blood Loss:  5 ml  Specimens:  sebaceous cyst  Complications:  None  Indications for Procedure:  This is a 51 y.o. female with diagnosis of a previously infected right posterior shoulder sebaceous cyst, s/p I&D in office on 05/24/21.  The patient wishes to have this excised to prevent recurrence. The risks of bleeding, abscess or infection, injury to surrounding structures, and need for further procedures were all discussed with the patient and she was willing to proceed.  Description of Procedure: The patient was correctly identified at bedside.  The patient was placed supine.  Appropriate time-outs were performed.  The patient's right posterior shoulder was prepped and draped in usual sterile fashion.  Local anesthetic was infused intradermally.  An elliptical 1.7 cm incision was made over the cyst, and scalpel was used to dissect down the skin and subcutaneous tissue.  Skin flaps were created sharply, and then the cyst was excised intact.  It was sent off to pathology.  The cavity was then irrigated and hemostasis was assured with a 3-0 Vicryl suture.  The wound was then closed in two layers using 3-0 Vicryl and 4-0 Monocryl.  The incision was cleaned and sealed with DermaBond.  The patient tolerated the procedure well and all sharps were appropriately disposed of at the end of the case.  --Patient may take Tylenol or Ibuprofen for pain control --May apply ice pack over the wound for comfort --Follow up in 2 weeks for wound check   Melvyn Neth, MD

## 2021-07-19 ENCOUNTER — Other Ambulatory Visit: Payer: Self-pay

## 2021-07-19 ENCOUNTER — Encounter: Payer: Self-pay | Admitting: Surgery

## 2021-07-19 ENCOUNTER — Ambulatory Visit (INDEPENDENT_AMBULATORY_CARE_PROVIDER_SITE_OTHER): Payer: BC Managed Care – PPO | Admitting: Surgery

## 2021-07-19 VITALS — BP 129/76 | HR 106 | Temp 99.0°F | Ht 63.0 in | Wt 124.4 lb

## 2021-07-19 DIAGNOSIS — L723 Sebaceous cyst: Secondary | ICD-10-CM

## 2021-07-19 DIAGNOSIS — Z09 Encounter for follow-up examination after completed treatment for conditions other than malignant neoplasm: Secondary | ICD-10-CM | POA: Diagnosis not present

## 2021-07-19 NOTE — Patient Instructions (Signed)
For the bumps around incision, you may try Hydrocortisone ointment and Benadryl OTC.   If you have any concerns or questions, please feel free to call our office. Follow up as needed.  Excision of Skin Lesions, Care After The following information offers guidance on how to care for yourself after your procedure. Your health care provider may also give you more specific instructions. If you have problems or questions, contact your health care provider. What can I expect after the procedure? After your procedure, it is common to have: Soreness or mild pain. Some redness and swelling. Follow these instructions at home: Excision site care  Follow instructions from your health care provider about how to take care of your excision site. Make sure you: Wash your hands with soap and water for at least 20 seconds before and after you change your bandage (dressing). If soap and water are not available, use hand sanitizer. Change your dressing as told by your health care provider. Leave stitches (sutures), skin glue, or adhesive strips in place. These skin closures may need to stay in place for 2 weeks or longer. If adhesive strip edges start to loosen and curl up, you may trim the loose edges. Do not remove adhesive strips completely unless your health care provider tells you to do that. Check the excision area every day for signs of infection. Watch for: More redness, swelling, or pain. Fluid or blood. Warmth. Pus or a bad smell. Keep the site clean, dry, and protected for at least 48 hours. For bleeding, apply gentle but firm pressure to the area using a folded towel for 20 minutes. Do not take baths, swim, or use a hot tub until your health care provider approves. Ask your health care provider if you may take showers. You may only be allowed to take sponge baths. General instructions Take over-the-counter and prescription medicines only as told by your health care provider. Follow instructions  from your health care provider about how to minimize scarring. Scarring should lessen over time. Avoid sun exposure until the area has healed. Use sunscreen to protect the area from the sun after it has healed. Avoid high-impact exercise and activities until the sutures are removed or the area heals. Keep all follow-up visits. This is important. Contact a health care provider if: You have more redness, swelling, or pain around your excision site. You have fluid or blood coming from your excision site. Your excision site feels warm to the touch. You have pus or a bad smell coming from your excision site. You have a fever. You have pain that does not improve in 2-3 days after your procedure. Get help right away if: You have bleeding that does not stop with pressure or a dressing. Your wound opens up. Summary Take over-the-counter and prescription medicines only as told by your health care provider. Change your dressing as told by your health care provider. Contact a health care provider if you have redness, swelling, pain, or other signs of infection around your excision site. Keep all follow-up visits. This is important. This information is not intended to replace advice given to you by your health care provider. Make sure you discuss any questions you have with your health care provider.

## 2021-07-19 NOTE — Progress Notes (Signed)
07/19/2021  HPI: Danielle Bishop is a 51 y.o. female s/p excision of a right posterior shoulder cyst on 07/05/21.  Patient presents for follow up.  Reports that over the weekend she started developing itching and skin bumps around the incision and tracking towards her lower neck.  Denies any pain, or wound breakdown, or drainage.  Vital signs: BP 129/76    Pulse (!) 106    Temp 99 F (37.2 C) (Oral)    Ht 5\' 3"  (1.6 m)    Wt 124 lb 6.4 oz (56.4 kg)    SpO2 94%    BMI 22.04 kg/m    Physical Exam: Constitutional: No acute distress Skin:  Right posterior shoulder excision site is healing well, without any evidence of infection or dehiscence.  She does have a mild papular rash centered at the incision and tracking towards the right lower neck.  No fluctuance or induration.  Assessment/Plan: This is a 51 y.o. female s/p excision of right posterior shoulder cyst.  --Discussed with the patient that the wound is healing well, but does seem to have developed an allergic reaction.  Comparing her prior I&D procedure and the excision, the DermaBond is the only new chemical that was used, as we used Chlorhexidine to clean the skin both times, and same type of Lidocaine as well.  So this could be a reaction to DermaBond.  It has been almost two weeks, but the glue is finally off, so this should improve.   --Recommended that she can use Benadryl ointment to help with itching +/- over-the-counter hydrocortisone cream to help with the rash.  Return precautions given. --Follow up as needed.   Melvyn Neth, [facility]

## 2021-08-30 ENCOUNTER — Ambulatory Visit: Payer: BC Managed Care – PPO | Admitting: Family Medicine

## 2021-10-14 ENCOUNTER — Ambulatory Visit: Payer: BLUE CROSS/BLUE SHIELD | Admitting: Family Medicine

## 2021-10-14 ENCOUNTER — Encounter: Payer: Self-pay | Admitting: Family Medicine

## 2021-10-14 VITALS — BP 120/80 | HR 80 | Ht 63.0 in | Wt 121.0 lb

## 2021-10-14 DIAGNOSIS — F419 Anxiety disorder, unspecified: Secondary | ICD-10-CM | POA: Diagnosis not present

## 2021-10-14 DIAGNOSIS — E785 Hyperlipidemia, unspecified: Secondary | ICD-10-CM

## 2021-10-14 DIAGNOSIS — B379 Candidiasis, unspecified: Secondary | ICD-10-CM

## 2021-10-14 DIAGNOSIS — Z124 Encounter for screening for malignant neoplasm of cervix: Secondary | ICD-10-CM

## 2021-10-14 DIAGNOSIS — F329 Major depressive disorder, single episode, unspecified: Secondary | ICD-10-CM | POA: Diagnosis not present

## 2021-10-14 DIAGNOSIS — Z1211 Encounter for screening for malignant neoplasm of colon: Secondary | ICD-10-CM | POA: Diagnosis not present

## 2021-10-14 MED ORDER — SERTRALINE HCL 50 MG PO TABS: 50.0000 mg | ORAL_TABLET | Freq: Every day | ORAL | 1 refills | Status: DC

## 2021-10-14 MED ORDER — NYSTATIN 100000 UNIT/GM EX CREA: 1.0000 "application " | TOPICAL_CREAM | Freq: Two times a day (BID) | CUTANEOUS | 5 refills | Status: AC

## 2021-10-14 NOTE — Progress Notes (Signed)
? ? ?Date:  10/14/2021  ? ?Name:  Danielle Bishop   DOB:  06/04/1971   MRN:  [id number] ? ? ?Chief Complaint: Depression ? ?Depression ?     The patient presents with depression.  This is a chronic problem.  The current episode started more than 1 year ago.   The onset quality is gradual.   The problem occurs intermittently.  The problem has been gradually improving since onset.  Associated symptoms include no decreased concentration, no fatigue, no helplessness, no hopelessness, does not have insomnia, not irritable, no restlessness, no decreased interest, no appetite change, no body aches, no myalgias, no headaches, no indigestion, not sad and no suicidal ideas.  Past treatments include SSRIs - Selective serotonin reuptake inhibitors.  Compliance with treatment is variable.  Previous treatment provided moderate relief.  Past medical history includes depression.   ? ?Lab Results  ?Component Value Date  ? NA 135 02/03/2021  ? K 3.9 02/03/2021  ? CO2 26 02/03/2021  ? GLUCOSE 100 (H) 02/03/2021  ? BUN 22 (H) 02/03/2021  ? CREATININE 0.98 02/03/2021  ? CALCIUM 8.7 (L) 02/03/2021  ? GFRNONAA >60 02/03/2021  ? ?No results found for: CHOL, HDL, LDLCALC, LDLDIRECT, TRIG, CHOLHDL ?No results found for: TSH ?No results found for: HGBA1C ?Lab Results  ?Component Value Date  ? WBC 9.1 02/03/2021  ? HGB 13.4 02/03/2021  ? HCT 37.7 02/03/2021  ? MCV 92.6 02/03/2021  ? PLT 260 02/03/2021  ? ?Lab Results  ?Component Value Date  ? ALT 11 02/03/2021  ? AST 17 02/03/2021  ? ALKPHOS 45 02/03/2021  ? BILITOT 0.6 02/03/2021  ? ?No results found for: 25OHVITD2, [place], VD25OH  ? ?Review of Systems  ?Constitutional:  Negative for appetite change, chills, fatigue and fever.  ?HENT:  Negative for drooling, ear discharge, ear pain and sore throat.   ?Respiratory:  Negative for cough, shortness of breath and wheezing.   ?Cardiovascular:  Negative for chest pain, palpitations and leg swelling.  ?Gastrointestinal:  Negative for abdominal pain, blood  in stool, constipation, diarrhea and nausea.  ?Endocrine: Negative for polydipsia.  ?Genitourinary:  Negative for dysuria, frequency, hematuria and urgency.  ?Musculoskeletal:  Negative for back pain, myalgias and neck pain.  ?Skin:  Negative for rash.  ?Allergic/Immunologic: Negative for environmental allergies.  ?Neurological:  Negative for dizziness and headaches.  ?Hematological:  Does not bruise/bleed easily.  ?Psychiatric/Behavioral:  Positive for depression. Negative for decreased concentration and suicidal ideas. The patient is not nervous/anxious and does not have insomnia.   ? ?Patient Active Problem List  ? Diagnosis Date Noted  ? Breast cancer ([place]) 01/01/2020  ? Family history of skin cancer   ? Family history of cancer of tongue   ? 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 Family history of breast cancer 07/09/2019  ? 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 ? ?No Known Allergies ? ?Past Surgical History:  ?Procedure Laterality Date  ? BREAST RECONSTRUCTION WITH PLACEMENT OF TISSUE EXPANDER AND FLEX HD (ACELLULAR HYDRATED DERMIS) Bilateral 01/01/2020  ? Procedure: BREAST RECONSTRUCTION WITH PLACEMENT OF TISSUE EXPANDER AND FLEX HD (ACELLULAR HYDRATED DERMIS);  Surgeon: [name], MD;  Location: [place];  Service: Plastics;  Laterality: Bilateral;  ? MASTECTOMY W/ SENTINEL NODE BIOPSY Bilateral 01/01/2020  ? Procedure: BILATERAL NIPPLE SPARING MASTECTOMY WITH LEFT SENTINEL LYMPH NODE BIOPSY;  Surgeon: Stark Klein, MD;  Location: [place];  Service: General;  Laterality: Bilateral;  PEC BLOCK  ? RADIOACTIVE SEED GUIDED EXCISIONAL BREAST BIOPSY Left 05/21/2019  ? Procedure: LEFT RADIOACTIVE SEED GUIDED EXCISIONAL BREAST BIOPSY;  Surgeon: Stark Klein, MD;  Location: [place];  Service: General;  Laterality: Left;  ? RE-EXCISION OF BREAST LUMPECTOMY Left 06/19/2019  ? Procedure: RE-EXCISION OF LEFT BREAST  LUMPECTOMY;  Surgeon: Stark Klein, MD;  Location: [place];  Service: General;  Laterality: Left;  ? REMOVAL OF BILATERAL TISSUE EXPANDERS WITH PLACEMENT OF BILATERAL BREAST IMPLANTS Bilateral 02/14/2020  ? Procedure: REMOVAL OF BILATERAL TISSUE EXPANDERS WITH PLACEMENT OF BILATERAL BREAST IMPLANTS;  Surgeon: [name], MD;  Location: [place];  Service: Plastics;  Laterality: Bilateral;  90 min  ? ? ?Social History  ? ?Tobacco Use  ? Smoking status: Every Day  ?  Packs/day: 0.50  ?  Years: 20.00  ?  Pack years: 10.00  ?  Types: Cigarettes  ? Smokeless tobacco: Never  ?Vaping Use  ? Vaping Use: Never used  ?Substance Use Topics  ? Alcohol use: Yes  ?  Comment: occas  ? Drug use: No  ? ? ? ?Medication list has been reviewed and updated. ? ?Current Meds  ?Medication Sig  ? sertraline (ZOLOFT) 50 MG tablet Take 1 tablet (50 mg total) by mouth daily.  ? ? ? ?  10/14/2021  ? 10:43 AM 03/02/2021  ?  8:15 AM 08/26/2020  ? 10:33 AM 10/18/2019  ?  9:53 AM  ?GAD 7 : Generalized Anxiety Score  ?Nervous, Anxious, on Edge 1 0 0 3  ?Control/stop worrying 0 0 1 2  ?Worry too much - different things 0 0 0 0  ?Trouble relaxing 0 0 0 2  ?Restless 0 0 0 0  ?Easily annoyed or irritable 1 0 0 0  ?Afraid - awful might happen 1 0 0 0  ?Total GAD 7 Score 3 0 1 7  ?Anxiety Difficulty Not difficult at all  Not difficult at all Not difficult at all  ? ? ? ?  10/14/2021  ? 10:42 AM  ?Depression screen PHQ 2/9  ?Decreased Interest 1  ?Down, Depressed, Hopeless 0  ?PHQ - 2 Score 1  ?Altered sleeping 1  ?Tired, decreased energy 1  ?Change in appetite 0  ?Feeling bad or failure about yourself  0  ?Trouble concentrating 0  ?Moving slowly or fidgety/restless 0  ?Suicidal thoughts 0  ?PHQ-9 Score 3  ?Difficult doing work/chores Not difficult at all  ? ? ?BP Readings from Last 3 Encounters:  ?10/14/21 120/80  ?07/19/21 129/76  ?07/05/21 123/77  ? ? ?Physical Exam ?Vitals and nursing note reviewed.  ?Constitutional:   ?    General: She is not irritable. ?   Appearance: She is well-developed.  ?HENT:  ?   Head: Normocephalic.  ?   Right Ear: Tympanic membrane and external ear normal.  ?   Left Ear: Tympanic membrane and external ear normal.  ?   Nose: Nose normal.  ?Eyes:  ?   General: Lids are everted, no foreign bodies appreciated. No scleral icterus.    ?   Left eye: No foreign body or hordeolum.  ?   Conjunctiva/sclera: Conjunctivae normal.  ?   Right eye: Right conjunctiva is not injected.  ?   Left eye: Left conjunctiva is not injected.  ?   Pupils: Pupils are equal, round, and reactive to light.  ?Neck:  ?   Thyroid: No thyromegaly.  ?   Vascular: No JVD.  ?  Trachea: No tracheal deviation.  ?Cardiovascular:  ?   Rate and Rhythm: Normal rate and regular rhythm.  ?   Heart sounds: Normal heart sounds. No murmur heard. ?  No friction rub. No gallop.  ?Pulmonary:  ?   Effort: Pulmonary effort is normal. No respiratory distress.  ?   Breath sounds: Normal breath sounds. No stridor. No wheezing, rhonchi or rales.  ?Abdominal:  ?   General: Bowel sounds are normal.  ?   Palpations: Abdomen is soft. There is no mass.  ?   Tenderness: There is no abdominal tenderness. There is no guarding or rebound.  ?Musculoskeletal:     ?   General: No tenderness. Normal range of motion.  ?   Cervical back: Normal range of motion and neck supple.  ?Lymphadenopathy:  ?   Cervical: No cervical adenopathy.  ?Skin: ?   General: Skin is warm.  ?   Findings: No rash.  ?Neurological:  ?   Mental Status: She is alert and oriented to person, place, and time.  ?   Cranial Nerves: No cranial nerve deficit.  ?   Deep Tendon Reflexes: Reflexes normal.  ?Psychiatric:     ?   Mood and Affect: Mood is not anxious or depressed.  ? ? ?Wt Readings from Last 3 Encounters:  ?10/14/21 121 lb (54.9 kg)  ?07/19/21 124 lb 6.4 oz (56.4 kg)  ?07/05/21 121 lb 9.6 oz (55.2 kg)  ? ? ?BP 120/80   Pulse 80   Ht '5\' 3"'$  (1.6 m)   Wt 121 lb (54.9 kg)   LMP 10/11/2021 (Exact Date)    BMI 21.43 kg/m?  ? ?Assessment and Plan: ? 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  Controlled.  Stable.  PHQ is 3.  Continue sertraline 50 mg once a day. ?- sertraline (ZOLOFT) 50 MG tablet; Take 1 tablet (5

## 2021-10-15 LAB — LIPID PANEL WITH LDL/HDL RATIO
Cholesterol, Total: 192 mg/dL (ref 100–199)
HDL: 89 mg/dL (ref >39)
LDL Chol Calc (NIH): 89 mg/dL (ref 0–99)
LDL/HDL Ratio: 1 ratio (ref 0.0–3.2)
Triglycerides: 75 mg/dL (ref 0–149)
VLDL Cholesterol Cal: 14 mg/dL (ref 5–40)

## 2021-10-18 ENCOUNTER — Other Ambulatory Visit: Payer: Self-pay

## 2021-10-18 DIAGNOSIS — Z1211 Encounter for screening for malignant neoplasm of colon: Secondary | ICD-10-CM

## 2021-10-18 MED ORDER — NA SULFATE-K SULFATE-MG SULF 17.5-3.13-1.6 GM/177ML PO SOLN: 1.0000 | Freq: Once | ORAL | 0 refills | Status: AC

## 2021-10-18 NOTE — Progress Notes (Signed)
Gastroenterology Pre-Procedure Review ? ?Request Date: 12/09/2021 ?Requesting Physician: Dr. Allen Norris ? ?PATIENT REVIEW QUESTIONS: The patient responded to the following health history questions as indicated:   ? ?1. Are you having any GI issues? no ?2. Do you have a personal history of Polyps? no ?3. Do you have a family history of Colon Cancer or Polyps? no ?4. Diabetes Mellitus? no ?5. Joint replacements in the past 12 months?no ?6. Major health problems in the past 3 months?no ?7. Any artificial heart valves, MVP, or defibrillator?no ?   ?MEDICATIONS & ALLERGIES:    ?Patient reports the following regarding taking any anticoagulation/antiplatelet therapy:   ?Plavix, Coumadin, Eliquis, Xarelto, Lovenox, Pradaxa, Brilinta, or Effient? no ?Aspirin? no ? ?Patient confirms/reports the following medications:  ?Current Outpatient Medications  ?Medication Sig Dispense Refill  ? nystatin cream (MYCOSTATIN) Apply 1 application. topically 2 (two) times daily. 30 g 5  ? sertraline (ZOLOFT) 50 MG tablet Take 1 tablet (50 mg total) by mouth daily. 90 tablet 1  ? ?No current facility-administered medications for this visit.  ? ? ?Patient confirms/reports the following allergies:  ?No Known Allergies ? ?No orders of the defined types were placed in this encounter. ? ? ?AUTHORIZATION INFORMATION ?Primary Insurance: ?1D#: ?Group #: ? ?Secondary Insurance: ?1D#: ?Group #: ? ?SCHEDULE INFORMATION: ?Date: 12/09/2021 ?Time: ?Location:[facility] ? ?

## 2021-12-01 ENCOUNTER — Encounter: Payer: Self-pay | Admitting: Gastroenterology

## 2021-12-01 DIAGNOSIS — Z1239 Encounter for other screening for malignant neoplasm of breast: Secondary | ICD-10-CM | POA: Diagnosis not present

## 2021-12-01 DIAGNOSIS — N6311 Unspecified lump in the right breast, upper outer quadrant: Secondary | ICD-10-CM | POA: Diagnosis not present

## 2021-12-01 DIAGNOSIS — Z01419 Encounter for gynecological examination (general) (routine) without abnormal findings: Secondary | ICD-10-CM | POA: Diagnosis not present

## 2021-12-01 DIAGNOSIS — Z124 Encounter for screening for malignant neoplasm of cervix: Secondary | ICD-10-CM | POA: Diagnosis not present

## 2021-12-09 ENCOUNTER — Ambulatory Visit
Admission: RE | Admit: 2021-12-09 | Discharge: 2021-12-09 | Disposition: A | Discharge status: Home / Self Care | Payer: BLUE CROSS/BLUE SHIELD | Attending: Gastroenterology | Admitting: Gastroenterology

## 2021-12-09 ENCOUNTER — Other Ambulatory Visit: Payer: Self-pay

## 2021-12-09 ENCOUNTER — Encounter: Payer: Self-pay | Admitting: Gastroenterology

## 2021-12-09 ENCOUNTER — Encounter
Admission: RE | Disposition: A | Discharge status: Home / Self Care | Payer: Self-pay | Source: Home / Self Care | Attending: Gastroenterology

## 2021-12-09 ENCOUNTER — Ambulatory Visit: Payer: BLUE CROSS/BLUE SHIELD | Admitting: Anesthesiology

## 2021-12-09 DIAGNOSIS — Z1211 Encounter for screening for malignant neoplasm of colon: Secondary | ICD-10-CM | POA: Diagnosis not present

## 2021-12-09 DIAGNOSIS — K635 Polyp of colon: Secondary | ICD-10-CM | POA: Insufficient documentation

## 2021-12-09 DIAGNOSIS — K640 First degree hemorrhoids: Secondary | ICD-10-CM | POA: Insufficient documentation

## 2021-12-09 DIAGNOSIS — F17210 Nicotine dependence, cigarettes, uncomplicated: Secondary | ICD-10-CM | POA: Diagnosis not present

## 2021-12-09 DIAGNOSIS — D125 Benign neoplasm of sigmoid colon: Secondary | ICD-10-CM | POA: Diagnosis not present

## 2021-12-09 HISTORY — PX: COLONOSCOPY WITH PROPOFOL: SHX5780

## 2021-12-09 HISTORY — PX: POLYPECTOMY: SHX5525

## 2021-12-09 LAB — POCT PREGNANCY, URINE: Preg Test, Ur: NEGATIVE

## 2021-12-09 SURGERY — COLONOSCOPY WITH PROPOFOL
Anesthesia: General | Site: Rectum

## 2021-12-09 MED ORDER — LACTATED RINGERS IV SOLN: INTRAVENOUS | Status: DC

## 2021-12-09 MED ORDER — STERILE WATER FOR IRRIGATION IR SOLN
Status: DC | PRN
  Administered 2021-12-09: 60 mL

## 2021-12-09 MED ORDER — LIDOCAINE HCL (CARDIAC) PF 100 MG/5ML IV SOSY
PREFILLED_SYRINGE | INTRAVENOUS | Status: DC | PRN
  Administered 2021-12-09: 30 mg via INTRAVENOUS

## 2021-12-09 MED ORDER — SODIUM CHLORIDE 0.9 % IV SOLN: INTRAVENOUS | Status: DC

## 2021-12-09 MED ORDER — PROPOFOL 10 MG/ML IV BOLUS
INTRAVENOUS | Status: DC | PRN
  Administered 2021-12-09: 40 mg via INTRAVENOUS
  Administered 2021-12-09: 100 mg via INTRAVENOUS
  Administered 2021-12-09 (×4): 30 mg via INTRAVENOUS
  Administered 2021-12-09: 40 mg via INTRAVENOUS
  Administered 2021-12-09: 30 mg via INTRAVENOUS

## 2021-12-09 MED ORDER — STERILE WATER FOR IRRIGATION IR SOLN
Status: DC | PRN
  Administered 2021-12-09: 100 mL

## 2021-12-09 SURGICAL SUPPLY — 17 items
CLIP HMST 235XBRD CATH ROT (MISCELLANEOUS) IMPLANT
CLIP RESOLUTION 360 11X235 (MISCELLANEOUS)
ELECT REM PT RETURN 9FT ADLT (ELECTROSURGICAL)
ELECTRODE REM PT RTRN 9FT ADLT (ELECTROSURGICAL) IMPLANT
FORCEPS BIOP RAD 4 LRG CAP 4 (CUTTING FORCEPS) ×1 IMPLANT
GOWN CVR UNV OPN BCK APRN NK (MISCELLANEOUS) ×2 IMPLANT
GOWN ISOL THUMB LOOP REG UNIV (MISCELLANEOUS) ×4
INJECTOR VARIJECT VIN23 (MISCELLANEOUS) IMPLANT
KIT PRC NS LF DISP ENDO (KITS) ×1 IMPLANT
KIT PROCEDURE OLYMPUS (KITS) ×2
MANIFOLD NEPTUNE II (INSTRUMENTS) ×2 IMPLANT
MARKER SPOT ENDO TATTOO 5ML (MISCELLANEOUS) IMPLANT
SNARE COLD EXACTO (MISCELLANEOUS) ×1 IMPLANT
SPOT EX ENDOSCOPIC TATTOO (MISCELLANEOUS)
TRAP ETRAP POLY (MISCELLANEOUS) ×1 IMPLANT
VARIJECT INJECTOR VIN23 (MISCELLANEOUS)
WATER STERILE IRR 250ML POUR (IV SOLUTION) ×2 IMPLANT

## 2021-12-09 NOTE — Anesthesia Preprocedure Evaluation (Signed)
Anesthesia Evaluation  Patient identified by MRN, date of birth, ID band Patient awake    History of Anesthesia Complications Negative for: history of anesthetic complications  Airway Mallampati: II  TM Distance: >3 FB Neck ROM: Full    Dental no notable dental hx.    Pulmonary Current Smoker (1/2 ppd) and Patient abstained from smoking.,    Pulmonary exam normal        Cardiovascular negative cardio ROS Normal cardiovascular exam     Neuro/Psych negative neurological ROS     GI/Hepatic negative GI ROS, Neg liver ROS,   Endo/Other  negative endocrine ROS  Renal/GU negative Renal ROS     Musculoskeletal   Abdominal   Peds  Hematology negative hematology ROS (+)   Anesthesia Other Findings   Reproductive/Obstetrics                             Anesthesia Physical Anesthesia Plan  ASA: 2  Anesthesia Plan: General   Post-op Pain Management: Minimal or no pain anticipated   Induction: Intravenous  PONV Risk Score and Plan: 2 and Propofol infusion, TIVA and Treatment may vary due to age or medical condition  Airway Management Planned: Nasal Cannula and Natural Airway  Additional Equipment: None  Intra-op Plan:   Post-operative Plan:   Informed Consent: I have reviewed the patients History and Physical, chart, labs and discussed the procedure including the risks, benefits and alternatives for the proposed anesthesia with the patient or authorized representative who has indicated his/her understanding and acceptance.       Plan Discussed with: CRNA  Anesthesia Plan Comments:         Anesthesia Quick Evaluation

## 2021-12-09 NOTE — Anesthesia Procedure Notes (Signed)
Date/Time: 12/09/2021 7:33 AM  Performed by: Cameron Ali, CRNAPre-anesthesia Checklist: Patient identified, Emergency Drugs available, Suction available, Timeout performed and Patient being monitored Patient Re-evaluated:Patient Re-evaluated prior to induction Oxygen Delivery Method: Nasal cannula Placement Confirmation: positive ETCO2

## 2021-12-09 NOTE — H&P (Signed)
Lucilla Lame, MD [address] [place], [place] [address] Phone: [phone] Fax : [phone]  Primary Care Physician:  Juline Patch, MD Primary Gastroenterologist:  Dr. Allen Norris  Pre-Procedure History & Physical: HPI:  Danielle Bishop is a 51 y.o. female is here for a screening colonoscopy.   Past Medical History:  Diagnosis Date   Anxiety    Breast cancer, left [facility])    dx 09/ 2020 by bx with atypical ductal hyperplasia/ papillnoma;   s/p  lefft breast excsional bx 05-21-2019 ,  dx DCIS   Family history of breast cancer    Family history of cancer of tongue    Family history of skin cancer     Past Surgical History:  Procedure Laterality Date   BREAST RECONSTRUCTION WITH PLACEMENT OF TISSUE EXPANDER AND FLEX HD (ACELLULAR HYDRATED DERMIS) Bilateral 01/01/2020   Procedure: BREAST RECONSTRUCTION WITH PLACEMENT OF TISSUE EXPANDER AND FLEX HD (ACELLULAR HYDRATED DERMIS);  Surgeon: [name], MD;  Location: [place];  Service: Plastics;  Laterality: Bilateral;   MASTECTOMY W/ SENTINEL NODE BIOPSY Bilateral 01/01/2020   Procedure: BILATERAL NIPPLE SPARING MASTECTOMY WITH LEFT SENTINEL LYMPH NODE BIOPSY;  Surgeon: Stark Klein, MD;  Location: [place];  Service: General;  Laterality: Bilateral;  PEC BLOCK   RADIOACTIVE SEED GUIDED EXCISIONAL BREAST BIOPSY Left 05/21/2019   Procedure: LEFT RADIOACTIVE SEED GUIDED EXCISIONAL BREAST BIOPSY;  Surgeon: Stark Klein, MD;  Location: [place];  Service: General;  Laterality: Left;   RE-EXCISION OF BREAST LUMPECTOMY Left 06/19/2019   Procedure: RE-EXCISION OF LEFT BREAST LUMPECTOMY;  Surgeon: Stark Klein, MD;  Location: [place];  Service: General;  Laterality: Left;   REMOVAL OF BILATERAL TISSUE EXPANDERS WITH PLACEMENT OF BILATERAL BREAST IMPLANTS Bilateral 02/14/2020   Procedure: REMOVAL OF BILATERAL TISSUE EXPANDERS WITH PLACEMENT OF BILATERAL BREAST IMPLANTS;   Surgeon: [name], MD;  Location: [place];  Service: Plastics;  Laterality: Bilateral;  90 min    Prior to Admission medications   Medication Sig Start Date End Date Taking? Authorizing Provider  nystatin cream (MYCOSTATIN) Apply 1 application. topically 2 (two) times daily. 10/14/21  Yes Juline Patch, MD  sertraline (ZOLOFT) 50 MG tablet Take 1 tablet (50 mg total) by mouth daily. 10/14/21  Yes Juline Patch, MD    Allergies as of 10/18/2021   (No Known Allergies)    Family History  Problem Relation Age of Onset   Stroke Mother    Breast cancer Maternal Grandmother 47   Skin cancer Paternal Grandmother    Skin cancer Paternal Grandfather    Breast cancer Other    Tongue cancer Other    Cancer Other        unknown type   Breast cancer Other 45   Breast cancer Other     Social History   Socioeconomic History   Marital status: Married    Spouse name: Not on file   Number of children: Not on file   Years of education: Not on file   Highest education level: Not on file  Occupational History   Not on file  Tobacco Use   Smoking status: Every Day    Packs/day: 0.50    Years: 20.00    Total pack years: 10.00    Types: Cigarettes   Smokeless tobacco: Never  Vaping Use   Vaping Use: Never used  Substance and Sexual Activity   Alcohol use: Yes  Alcohol/week: 21.0 standard drinks of alcohol    Types: 21 Cans of beer per week    Comment: occas   Drug use: No   Sexual activity: Yes    Birth control/protection: None  Other Topics Concern   Not on file  Social History Narrative   Not on file   Social Determinants of Health   Financial Resource Strain: Not on file  Food Insecurity: Not on file  Transportation Needs: Not on file  Physical Activity: Not on file  Stress: Not on file  Social Connections: Not on file  Intimate Partner Violence: Not on file    Review of Systems: See HPI, otherwise negative ROS  Physical Exam: BP 121/76    Pulse 71   Temp 97.9 F (36.6 C) (Temporal)   Resp 16   Ht '5\' 3"'$  (1.6 m)   Wt 52.6 kg   SpO2 98%   BMI 20.55 kg/m  General:   Alert,  pleasant and cooperative in NAD Head:  Normocephalic and atraumatic. Neck:  Supple; no masses or thyromegaly. Lungs:  Clear throughout to auscultation.    Heart:  Regular rate and rhythm. Abdomen:  Soft, nontender and nondistended. Normal bowel sounds, without guarding, and without rebound.   Neurologic:  Alert and  oriented x4;  grossly normal neurologically.  Impression/Plan: Danielle Bishop is now here to undergo a screening colonoscopy.  Risks, benefits, and alternatives regarding colonoscopy have been reviewed with the patient.  Questions have been answered.  All parties agreeable.

## 2021-12-09 NOTE — Transfer of Care (Signed)
Immediate Anesthesia Transfer of Care Note  Patient: Danielle Bishop  Procedure(s) Performed: COLONOSCOPY WITH BIOPSY (Rectum) POLYPECTOMY (Rectum)  Patient Location: PACU  Anesthesia Type: General  Level of Consciousness: awake, alert  and patient cooperative  Airway and Oxygen Therapy: Patient Spontanous Breathing and Patient connected to supplemental oxygen  Post-op Assessment: Post-op Vital signs reviewed, Patient's Cardiovascular Status Stable, Respiratory Function Stable, Patent Airway and No signs of Nausea or vomiting  Post-op Vital Signs: Reviewed and stable  Complications: No notable events documented.

## 2021-12-09 NOTE — Anesthesia Postprocedure Evaluation (Signed)
Anesthesia Post Note  Patient: Danielle Bishop  Procedure(s) Performed: COLONOSCOPY WITH BIOPSY (Rectum) POLYPECTOMY (Rectum)     Patient location during evaluation: PACU Anesthesia Type: General Level of consciousness: awake and alert Pain management: pain level controlled Vital Signs Assessment: post-procedure vital signs reviewed and stable Respiratory status: spontaneous breathing, nonlabored ventilation, respiratory function stable and patient connected to nasal cannula oxygen Cardiovascular status: blood pressure returned to baseline and stable Postop Assessment: no apparent nausea or vomiting Anesthetic complications: no   There were no known notable events for this encounter.  Adele Barthel [name]

## 2021-12-09 NOTE — Op Note (Signed)
[facility] Patient Name: Danielle Bishop Procedure Date: 12/09/2021 7:14 AM MRN: [id number] Account #: [id number] Date of Birth: 07/22/1970 Admit Type: Outpatient Age: 51 Room: [facility] OR ROOM 01 Gender: Female Note Status: Finalized Instrument Name: [id number] Procedure:             Colonoscopy Indications:           Screening for colorectal malignant neoplasm Providers:             Lucilla Lame MD, MD Referring MD:          Juline Patch, MD (Referring MD) Medicines:             Propofol per Anesthesia Complications:         No immediate complications. Procedure:             Pre-Anesthesia Assessment:                        - Prior to the procedure, a History and Physical was                         performed, and patient medications and allergies were                         reviewed. The patient's tolerance of previous                         anesthesia was also reviewed. The risks and benefits                         of the procedure and the sedation options and risks                         were discussed with the patient. All questions were                         answered, and informed consent was obtained. Prior                         Anticoagulants: The patient has taken no previous                         anticoagulant or antiplatelet agents. ASA Grade                         Assessment: II - A patient with mild systemic disease.                         After reviewing the risks and benefits, the patient                         was deemed in satisfactory condition to undergo the                         procedure.                        After obtaining informed consent, the colonoscope was  passed under direct vision. Throughout the procedure,                         the patient's blood pressure, pulse, and oxygen                         saturations were monitored continuously. The                         Colonoscope was  introduced through the anus and                         advanced to the the cecum, identified by appendiceal                         orifice and ileocecal valve. The colonoscopy was                         performed without difficulty. The patient tolerated                         the procedure well. The quality of the bowel                         preparation was excellent. Findings:      The perianal and digital rectal examinations were normal.      Two sessile polyps were found in the sigmoid colon. The polyps were 1 to       3 mm in size. These polyps were removed with a cold biopsy forceps.       Resection and retrieval were complete.      A 7 mm polyp was found in the recto-sigmoid colon. The polyp was       sessile. The polyp was removed with a cold snare. Resection and       retrieval were complete.      Non-bleeding internal hemorrhoids were found during retroflexion. The       hemorrhoids were Grade I (internal hemorrhoids that do not prolapse). Impression:            - Two 1 to 3 mm polyps in the sigmoid colon, removed                         with a cold biopsy forceps. Resected and retrieved.                        - One 7 mm polyp at the recto-sigmoid colon, removed                         with a cold snare. Resected and retrieved.                        - Non-bleeding internal hemorrhoids. Recommendation:        - Discharge patient to home.                        - Resume previous diet.                        - Continue present  medications.                        - Await pathology results.                        - If the pathology report reveals adenomatous tissue,                         then repeat the colonoscopy for surveillance in 7                         years. Procedure Code(s):     --- Professional ---                        [phone], Colonoscopy, flexible; with removal of                         tumor(s), polyp(s), or other lesion(s) by snare                          technique                        45380, 64, Colonoscopy, flexible; with biopsy, single                         or multiple Diagnosis Code(s):     --- Professional ---                        Z12.11, Encounter for screening for malignant neoplasm                         of colon                        K63.5, Polyp of colon CPT copyright 2019 American Medical Association. All rights reserved. The codes documented in this report are preliminary and upon coder review may  be revised to meet current compliance requirements. Lucilla Lame MD, MD 12/09/2021 7:47:35 AM This report has been signed electronically. Number of Addenda: 0 Note Initiated On: 12/09/2021 7:14 AM Scope Withdrawal Time: 0 hours 7 minutes 34 seconds  Total Procedure Duration: 0 hours 11 minutes 21 seconds  Estimated Blood Loss:  Estimated blood loss: none.      [facility]

## 2021-12-13 ENCOUNTER — Encounter: Payer: Self-pay | Admitting: Gastroenterology

## 2021-12-13 LAB — SURGICAL PATHOLOGY

## 2021-12-14 ENCOUNTER — Encounter: Payer: Self-pay | Admitting: Gastroenterology

## 2022-02-02 ENCOUNTER — Inpatient Hospital Stay: Payer: BLUE CROSS/BLUE SHIELD | Admitting: Medical Oncology

## 2022-02-02 ENCOUNTER — Ambulatory Visit: Payer: BC Managed Care – PPO | Admitting: Oncology

## 2022-03-02 ENCOUNTER — Ambulatory Visit: Payer: BLUE CROSS/BLUE SHIELD | Admitting: Family Medicine

## 2022-03-09 ENCOUNTER — Encounter: Payer: Self-pay | Admitting: Family Medicine

## 2022-03-09 ENCOUNTER — Ambulatory Visit: Payer: BLUE CROSS/BLUE SHIELD | Admitting: Family Medicine

## 2022-03-09 VITALS — BP 110/80 | HR 80 | Ht 63.0 in | Wt 123.0 lb

## 2022-03-09 DIAGNOSIS — F419 Anxiety disorder, unspecified: Secondary | ICD-10-CM

## 2022-03-09 DIAGNOSIS — F329 Major depressive disorder, single episode, unspecified: Secondary | ICD-10-CM | POA: Diagnosis not present

## 2022-03-09 DIAGNOSIS — Z23 Encounter for immunization: Secondary | ICD-10-CM | POA: Diagnosis not present

## 2022-03-09 MED ORDER — SERTRALINE HCL 50 MG PO TABS
50.0000 mg | ORAL_TABLET | Freq: Every day | ORAL | 1 refills | Status: DC
Start: 2022-03-09 — End: 2022-12-30

## 2022-03-09 NOTE — Progress Notes (Signed)
Date:  03/09/2022   Name:  Danielle Bishop   DOB:  26-Mar-1971   MRN:  [id number]   Chief Complaint: Depression and Flu Vaccine  Depression        This is a chronic problem.  The current episode started more than 1 year ago.   The onset quality is gradual.   The problem has been gradually improving since onset.  Associated symptoms include fatigue and sad.  Associated symptoms include no decreased concentration, no helplessness, no hopelessness, does not have insomnia, not irritable, no restlessness, no decreased interest, no appetite change, no body aches, no myalgias, no headaches, no indigestion and no suicidal ideas.     The symptoms are aggravated by nothing.  Past treatments include SSRIs - Selective serotonin reuptake inhibitors.  Compliance with treatment is good.  Previous treatment provided mild relief.   Lab Results  Component Value Date   NA 135 02/03/2021   K 3.9 02/03/2021   CO2 26 02/03/2021   GLUCOSE 100 (H) 02/03/2021   BUN 22 (H) 02/03/2021   CREATININE 0.98 02/03/2021   CALCIUM 8.7 (L) 02/03/2021   GFRNONAA >60 02/03/2021   Lab Results  Component Value Date   CHOL 192 10/14/2021   HDL 89 10/14/2021   LDLCALC 89 10/14/2021   TRIG 75 10/14/2021   No results found for: "TSH" No results found for: "HGBA1C" Lab Results  Component Value Date   WBC 9.1 02/03/2021   HGB 13.4 02/03/2021   HCT 37.7 02/03/2021   MCV 92.6 02/03/2021   PLT 260 02/03/2021   Lab Results  Component Value Date   ALT 11 02/03/2021   AST 17 02/03/2021   ALKPHOS 45 02/03/2021   BILITOT 0.6 02/03/2021   No results found for: "25OHVITD2", "25OHVITD3", "VD25OH"   Review of Systems  Constitutional:  Positive for fatigue. Negative for appetite change, chills, fever and unexpected weight change.  HENT:  Negative for congestion, ear discharge, ear pain, rhinorrhea, sinus pressure, sneezing and sore throat.   Respiratory:  Negative for cough, shortness of breath, wheezing and stridor.    Cardiovascular:  Negative for chest pain, palpitations and leg swelling.  Gastrointestinal:  Negative for abdominal pain, blood in stool, constipation, diarrhea and nausea.  Genitourinary:  Negative for dysuria, flank pain, frequency, hematuria, urgency and vaginal discharge.  Musculoskeletal:  Negative for arthralgias, back pain and myalgias.  Skin:  Negative for rash.  Neurological:  Negative for dizziness, weakness and headaches.  Hematological:  Negative for adenopathy. Does not bruise/bleed easily.  Psychiatric/Behavioral:  Positive for depression. Negative for decreased concentration, dysphoric mood and suicidal ideas. The patient is not nervous/anxious and does not have insomnia.     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    No Known Allergies  Past Surgical History:  Procedure Laterality Date   BREAST RECONSTRUCTION WITH PLACEMENT OF TISSUE EXPANDER AND FLEX HD (ACELLULAR HYDRATED DERMIS) Bilateral 01/01/2020   Procedure: BREAST RECONSTRUCTION WITH PLACEMENT OF TISSUE EXPANDER AND FLEX HD (ACELLULAR HYDRATED DERMIS);  Surgeon: [name], MD;  Location: [place];  Service: Plastics;  Laterality:  Bilateral;   COLONOSCOPY WITH PROPOFOL N/A 12/09/2021   Procedure: COLONOSCOPY WITH BIOPSY;  Surgeon: Lucilla Lame, MD;  Location: [place];  Service: Endoscopy;  Laterality: N/A;   MASTECTOMY W/ SENTINEL NODE BIOPSY Bilateral 01/01/2020   Procedure: BILATERAL NIPPLE SPARING MASTECTOMY WITH LEFT SENTINEL LYMPH NODE BIOPSY;  Surgeon: Stark Klein, MD;  Location: [place];  Service: General;  Laterality:  Bilateral;  PEC BLOCK   POLYPECTOMY N/A 12/09/2021   Procedure: POLYPECTOMY;  Surgeon: Lucilla Lame, MD;  Location: [place];  Service: Endoscopy;  Laterality: N/A;   RADIOACTIVE SEED GUIDED EXCISIONAL BREAST BIOPSY Left 05/21/2019   Procedure: LEFT RADIOACTIVE SEED GUIDED EXCISIONAL BREAST BIOPSY;  Surgeon: Stark Klein, MD;  Location: [place];  Service: General;  Laterality: Left;   RE-EXCISION OF BREAST LUMPECTOMY Left 06/19/2019   Procedure: RE-EXCISION OF LEFT BREAST LUMPECTOMY;  Surgeon: Stark Klein, MD;  Location: [place];  Service: General;  Laterality: Left;   REMOVAL OF BILATERAL TISSUE EXPANDERS WITH PLACEMENT OF BILATERAL BREAST IMPLANTS Bilateral 02/14/2020   Procedure: REMOVAL OF BILATERAL TISSUE EXPANDERS WITH PLACEMENT OF BILATERAL BREAST IMPLANTS;  Surgeon: [name], MD;  Location: [place];  Service: Plastics;  Laterality: Bilateral;  90 min    Social History   Tobacco Use   Smoking status: Every Day    Packs/day: 0.50    Years: 20.00    Total pack years: 10.00    Types: Cigarettes   Smokeless tobacco: Never  Vaping Use   Vaping Use: Never used  Substance Use Topics   Alcohol use: Yes    Alcohol/week: 21.0 standard drinks of alcohol    Types: 21 Cans of beer per week    Comment: occas   Drug use: No     Medication list has been reviewed and updated.  Current Meds  Medication Sig   nystatin cream (MYCOSTATIN) Apply 1 application. topically 2 (two) times daily.   sertraline (ZOLOFT) 50 MG tablet Take 1 tablet (50 mg total) by mouth daily.       03/09/2022   10:01 AM 10/14/2021   10:43 AM 03/02/2021    8:15 AM 08/26/2020   10:33 AM  GAD 7 : Generalized Anxiety Score  Nervous, Anxious, on Edge 0 1 0 0  Control/stop worrying 0 0 0 1  Worry too much - different things 0 0 0 0  Trouble relaxing 0 0 0 0  Restless 0 0 0 0  Easily annoyed or irritable 2 1 0 0  Afraid - awful might happen 0 1 0 0   Total GAD 7 Score 2 3 0 1  Anxiety Difficulty Not difficult at all Not difficult at all  Not difficult at all       03/09/2022   10:01 AM 10/14/2021   10:42 AM 03/02/2021    8:15 AM  Depression screen PHQ 2/9  Decreased Interest 0 1 0  Down, Depressed, Hopeless 1 0 0  PHQ - 2 Score 1 1 0  Altered sleeping 0 1 0  Tired, decreased energy 1 1 0  Change in appetite 0 0 0  Feeling bad or failure about yourself  0 0 0  Trouble concentrating 0 0 0  Moving slowly or fidgety/restless 0 0 0  Suicidal thoughts 0 0 0  PHQ-9 Score 2 3 0  Difficult doing work/chores Not difficult at all Not difficult at all     BP Readings from Last 3 Encounters:  03/09/22 110/80  12/09/21 104/71  10/14/21 120/80    Physical Exam Vitals and nursing note reviewed. Exam conducted with a chaperone present.  Constitutional:      General: She is not irritable.She is not in acute distress.    Appearance: She is not diaphoretic.  HENT:     Head: Normocephalic and atraumatic.     Right Ear: External ear normal.     Left Ear: External ear normal.     Nose: Nose normal.  Eyes:     General:        Right eye: No discharge.        Left eye: No discharge.     Conjunctiva/sclera: Conjunctivae normal.     Pupils: Pupils are equal, round, and reactive to light.  Neck:     Thyroid: No thyromegaly.     Vascular: No JVD.  Cardiovascular:     Rate and Rhythm: Normal rate and regular rhythm.     Heart sounds: Normal heart sounds. No murmur heard.    No friction rub. No gallop.  Pulmonary:     Effort: Pulmonary effort is normal.     Breath sounds: Normal breath sounds.  Abdominal:     General: Bowel sounds are normal.     Palpations: Abdomen is soft. There is no mass.     Tenderness: There is no abdominal tenderness. There is no guarding.  Musculoskeletal:        General: Normal range of motion.     Cervical back: Normal range of motion and neck supple.  Lymphadenopathy:     Cervical: No cervical adenopathy.   Skin:    General: Skin is warm and dry.  Neurological:     Mental Status: She is alert.     Wt Readings from Last 3 Encounters:  03/09/22 123 lb (55.8 kg)  12/09/21 116 lb (52.6 kg)  10/14/21 121 lb (54.9 kg)    BP 110/80   Pulse 80   Ht '5\' 3"'$  (1.6 m)   Wt 123 lb (55.8 kg)   LMP 02/23/2022 (Approximate)   BMI 21.79 kg/m   Assessment and Plan:  1. Reactive depression Chronic.  Controlled.  Stable.  PHQ is 2.  Continue sertraline 50 mg once a day. - sertraline (ZOLOFT) 50 MG tablet; Take 1 tablet (50 mg total) by mouth daily.  Dispense: 90 tablet; Refill: 1  2. Anxiety Chronic.  Controlled.  Stable.  GAD score is 2.  Continue sertraline 50 mg. - sertraline (ZOLOFT) 50 MG tablet; Take 1 tablet (50 mg total) by mouth daily.  Dispense: 90 tablet; Refill: 1  3. Hypocalcemia Patient history of ductal carcinoma of the breast.  Patient had a mild decrease in calcium in the past and we will recheck with CMP along with liver function tests as well. - Comprehensive Metabolic Panel (CMET)  4. Need for immunization against influenza Discussed and administered. - Flu Vaccine QUAD 19moIM (Fluarix, Fluzone & Alfiuria Quad PF)    DOtilio Miu MD

## 2022-03-10 LAB — COMPREHENSIVE METABOLIC PANEL
ALT: 10 IU/L (ref 0–32)
AST: 13 IU/L (ref 0–40)
Albumin/Globulin Ratio: 1.8 (ref 1.2–2.2)
Albumin: 4.6 g/dL (ref 3.9–4.9)
Alkaline Phosphatase: 51 IU/L (ref 44–121)
BUN/Creatinine Ratio: 17 (ref 9–23)
BUN: 15 mg/dL (ref 6–24)
Bilirubin Total: 0.3 mg/dL (ref 0.0–1.2)
CO2: 23 mmol/L (ref 20–29)
Calcium: 9.6 mg/dL (ref 8.7–10.2)
Chloride: 104 mmol/L (ref 96–106)
Creatinine, Ser: 0.86 mg/dL (ref 0.57–1.00)
Globulin, Total: 2.5 g/dL (ref 1.5–4.5)
Glucose: 86 mg/dL (ref 70–99)
Potassium: 4.8 mmol/L (ref 3.5–5.2)
Sodium: 140 mmol/L (ref 134–144)
Total Protein: 7.1 g/dL (ref 6.0–8.5)
eGFR: 83 mL/min/{1.73_m2} (ref >59)

## 2022-12-26 ENCOUNTER — Telehealth: Payer: Self-pay | Admitting: Family Medicine

## 2022-12-26 NOTE — Telephone Encounter (Signed)
-----   Message from [facility] Tara L sent at 12/26/2022 12:03 PM EDT ----- Please call for med refill appt- not seen since last Sept

## 2022-12-26 NOTE — Telephone Encounter (Signed)
Left voice mail to set up med refill  appointment

## 2022-12-30 ENCOUNTER — Ambulatory Visit (INDEPENDENT_AMBULATORY_CARE_PROVIDER_SITE_OTHER): Payer: BC Managed Care – PPO | Admitting: Family Medicine

## 2022-12-30 VITALS — BP 120/78 | HR 85 | Ht 63.0 in | Wt 125.0 lb

## 2022-12-30 DIAGNOSIS — F329 Major depressive disorder, single episode, unspecified: Secondary | ICD-10-CM | POA: Diagnosis not present

## 2022-12-30 DIAGNOSIS — R69 Illness, unspecified: Secondary | ICD-10-CM

## 2022-12-30 DIAGNOSIS — F419 Anxiety disorder, unspecified: Secondary | ICD-10-CM

## 2022-12-30 MED ORDER — SERTRALINE HCL 50 MG PO TABS: 50.0000 mg | ORAL_TABLET | Freq: Every day | ORAL | 1 refills | Status: AC

## 2022-12-30 NOTE — Progress Notes (Signed)
Date:  12/30/2022   Name:  Danielle Bishop   DOB:  [birth year]   MRN:  [id number]   Chief Complaint: No chief complaint on file.  HPI  Lab Results  Component Value Date   NA 140 03/09/2022   K 4.8 03/09/2022   CO2 23 03/09/2022   GLUCOSE 86 03/09/2022   BUN 15 03/09/2022   CREATININE 0.86 03/09/2022   CALCIUM 9.6 03/09/2022   EGFR 83 03/09/2022   GFRNONAA >60 02/03/2021   Lab Results  Component Value Date   CHOL 192 10/14/2021   HDL 89 10/14/2021   LDLCALC 89 10/14/2021   TRIG 75 10/14/2021   No results found for: "TSH" No results found for: "HGBA1C" Lab Results  Component Value Date   WBC 9.1 02/03/2021   HGB 13.4 02/03/2021   HCT 37.7 02/03/2021   MCV 92.6 02/03/2021   PLT 260 02/03/2021   Lab Results  Component Value Date   ALT 10 03/09/2022   AST 13 03/09/2022   ALKPHOS 51 03/09/2022   BILITOT 0.3 03/09/2022   No results found for: "25OHVITD2", "25OHVITD3", "VD25OH"   Review of Systems  Patient Active Problem List   Diagnosis Date Noted   Colon cancer screening    Polyp of sigmoid colon    Breast cancer (HCC) 01/01/2020   Family history of skin cancer    Family history of cancer of tongue    Atypical lobular hyperplasia (ALH) of right breast 08/01/2019   Family history of breast cancer 07/09/2019   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    No Known Allergies  Past Surgical History:  Procedure Laterality Date   BREAST RECONSTRUCTION WITH PLACEMENT OF TISSUE EXPANDER AND FLEX HD (ACELLULAR HYDRATED DERMIS) Bilateral 01/01/2020   Procedure: BREAST RECONSTRUCTION WITH PLACEMENT OF TISSUE EXPANDER AND FLEX HD (ACELLULAR HYDRATED DERMIS);  Surgeon: Allena Napoleon, MD;  Location: [facility];  Service: Plastics;  Laterality: Bilateral;   COLONOSCOPY WITH PROPOFOL N/A 12/09/2021   Procedure: COLONOSCOPY WITH BIOPSY;  Surgeon: Midge Minium, MD;  Location: [facility];  Service: Endoscopy;  Laterality: N/A;    MASTECTOMY W/ SENTINEL NODE BIOPSY Bilateral 01/01/2020   Procedure: BILATERAL NIPPLE SPARING MASTECTOMY WITH LEFT SENTINEL LYMPH NODE BIOPSY;  Surgeon: Almond Lint, MD;  Location: [facility];  Service: General;  Laterality: Bilateral;  PEC BLOCK   POLYPECTOMY N/A 12/09/2021   Procedure: POLYPECTOMY;  Surgeon: Midge Minium, MD;  Location: [facility];  Service: Endoscopy;  Laterality: N/A;   RADIOACTIVE SEED GUIDED EXCISIONAL BREAST BIOPSY Left 05/21/2019   Procedure: LEFT RADIOACTIVE SEED GUIDED EXCISIONAL BREAST BIOPSY;  Surgeon: Almond Lint, MD;  Location: [facility];  Service: General;  Laterality: Left;   RE-EXCISION OF BREAST LUMPECTOMY Left 06/19/2019   Procedure: RE-EXCISION OF LEFT BREAST LUMPECTOMY;  Surgeon: Almond Lint, MD;  Location: [facility];  Service: General;  Laterality: Left;   REMOVAL OF BILATERAL TISSUE EXPANDERS WITH PLACEMENT OF BILATERAL BREAST IMPLANTS Bilateral 02/14/2020   Procedure: REMOVAL OF BILATERAL TISSUE EXPANDERS WITH PLACEMENT OF BILATERAL BREAST IMPLANTS;  Surgeon: Allena Napoleon, MD;  Location: [facility];  Service: Plastics;  Laterality: Bilateral;  90 min    Social History   Tobacco Use   Smoking status: Every Day    Current packs/day: 0.50    Average packs/day: 0.5 packs/day for 20.0 years (10.0 ttl pk-yrs)    Types: Cigarettes   Smokeless tobacco: Never  Vaping  Use   Vaping status: Never Used  Substance Use Topics   Alcohol use: Yes    Alcohol/week: 21.0 standard drinks of alcohol    Types: 21 Cans of beer per week    Comment: occas   Drug use: No     Medication list has been reviewed and updated.  Current Meds  Medication Sig   nystatin cream (MYCOSTATIN) Apply 1 application. topically 2 (two) times daily.   [DISCONTINUED] sertraline (ZOLOFT) 50 MG tablet Take 1 tablet (50 mg total) by mouth daily.       03/09/2022   10:01 AM 10/14/2021   10:43 AM 03/02/2021    8:15  AM 08/26/2020   10:33 AM  GAD 7 : Generalized Anxiety Score  Nervous, Anxious, on Edge 0 1 0 0  Control/stop worrying 0 0 0 1  Worry too much - different things 0 0 0 0  Trouble relaxing 0 0 0 0  Restless 0 0 0 0  Easily annoyed or irritable 2 1 0 0  Afraid - awful might happen 0 1 0 0  Total GAD 7 Score 2 3 0 1  Anxiety Difficulty Not difficult at all Not difficult at all  Not difficult at all       03/09/2022   10:01 AM 10/14/2021   10:42 AM 03/02/2021    8:15 AM  Depression screen PHQ 2/9  Decreased Interest 0 1 0  Down, Depressed, Hopeless 1 0 0  PHQ - 2 Score 1 1 0  Altered sleeping 0 1 0  Tired, decreased energy 1 1 0  Change in appetite 0 0 0  Feeling bad or failure about yourself  0 0 0  Trouble concentrating 0 0 0  Moving slowly or fidgety/restless 0 0 0  Suicidal thoughts 0 0 0  PHQ-9 Score 2 3 0  Difficult doing work/chores Not difficult at all Not difficult at all     BP Readings from Last 3 Encounters:  12/30/22 120/78  03/09/22 110/80  12/09/21 104/71    Physical Exam  Wt Readings from Last 3 Encounters:  12/30/22 125 lb (56.7 kg)  03/09/22 123 lb (55.8 kg)  12/09/21 116 lb (52.6 kg)    BP 120/78   Pulse 85   Ht 5\' 3"  (1.6 m)   Wt 125 lb (56.7 kg)   SpO2 99%   BMI 22.14 kg/m   Assessment and Plan: 1. Reactive depression See Written Note. PHQ 0 - sertraline (ZOLOFT) 50 MG tablet; Take 1 tablet (50 mg total) by mouth daily.  Dispense: 90 tablet; Refill: 1  2. Anxiety GAD 0 - sertraline (ZOLOFT) 50 MG tablet; Take 1 tablet (50 mg total) by mouth daily.  Dispense: 90 tablet; Refill: 1  3. Taking medication for chronic disease  - Lipid Panel With LDL/HDL Ratio - Comprehensive Metabolic Panel (CMET)     Elizabeth Sauer, MD

## 2022-12-31 LAB — COMPREHENSIVE METABOLIC PANEL
ALT: 9 IU/L (ref 0–32)
AST: 14 IU/L (ref 0–40)
Albumin: 4.6 g/dL (ref 3.9–4.9)
Alkaline Phosphatase: 54 IU/L (ref 44–121)
BUN/Creatinine Ratio: 14 (ref 9–23)
BUN: 13 mg/dL (ref 6–24)
Bilirubin Total: 0.5 mg/dL (ref 0.0–1.2)
CO2: 21 mmol/L (ref 20–29)
Calcium: 9.3 mg/dL (ref 8.7–10.2)
Chloride: 101 mmol/L (ref 96–106)
Creatinine, Ser: 0.93 mg/dL (ref 0.57–1.00)
Globulin, Total: 2.5 g/dL (ref 1.5–4.5)
Glucose: 91 mg/dL (ref 70–99)
Potassium: 5.3 mmol/L — ABNORMAL HIGH (ref 3.5–5.2)
Sodium: 140 mmol/L (ref 134–144)
Total Protein: 7.1 g/dL (ref 6.0–8.5)
eGFR: 75 mL/min/{1.73_m2} (ref >59)

## 2022-12-31 LAB — LIPID PANEL WITH LDL/HDL RATIO
Cholesterol, Total: 201 mg/dL — ABNORMAL HIGH (ref 100–199)
HDL: 95 mg/dL (ref >39)
LDL Chol Calc (NIH): 83 mg/dL (ref 0–99)
LDL/HDL Ratio: 0.9 ratio (ref 0.0–3.2)
Triglycerides: 135 mg/dL (ref 0–149)
VLDL Cholesterol Cal: 23 mg/dL (ref 5–40)

## 2023-03-20 DIAGNOSIS — C50512 Malignant neoplasm of lower-outer quadrant of left female breast: Secondary | ICD-10-CM | POA: Diagnosis not present

## 2023-03-20 DIAGNOSIS — T85848A Pain due to other internal prosthetic devices, implants and grafts, initial encounter: Secondary | ICD-10-CM | POA: Diagnosis not present

## 2023-03-20 DIAGNOSIS — Z170 Estrogen receptor positive status [ER+]: Secondary | ICD-10-CM | POA: Diagnosis not present

## 2023-03-20 DIAGNOSIS — Z9882 Breast implant status: Secondary | ICD-10-CM | POA: Diagnosis not present

## 2023-04-03 ENCOUNTER — Other Ambulatory Visit: Payer: Self-pay | Admitting: General Surgery

## 2023-04-03 DIAGNOSIS — Z9882 Breast implant status: Secondary | ICD-10-CM

## 2023-04-03 DIAGNOSIS — C50512 Malignant neoplasm of lower-outer quadrant of left female breast: Secondary | ICD-10-CM

## 2023-04-03 DIAGNOSIS — T85848A Pain due to other internal prosthetic devices, implants and grafts, initial encounter: Secondary | ICD-10-CM

## 2023-05-17 ENCOUNTER — Encounter: Payer: Self-pay | Admitting: General Surgery

## 2023-05-20 ENCOUNTER — Ambulatory Visit
Admission: RE | Admit: 2023-05-20 | Discharge: 2023-05-20 | Disposition: A | Discharge status: Home / Self Care | Payer: Self-pay | Source: Ambulatory Visit | Attending: General Surgery | Admitting: General Surgery

## 2023-05-20 DIAGNOSIS — N644 Mastodynia: Secondary | ICD-10-CM | POA: Diagnosis not present

## 2023-05-20 DIAGNOSIS — Z853 Personal history of malignant neoplasm of breast: Secondary | ICD-10-CM | POA: Diagnosis not present

## 2023-05-20 DIAGNOSIS — T85848A Pain due to other internal prosthetic devices, implants and grafts, initial encounter: Secondary | ICD-10-CM

## 2023-05-20 DIAGNOSIS — Z9882 Breast implant status: Secondary | ICD-10-CM

## 2023-05-20 DIAGNOSIS — Z170 Estrogen receptor positive status [ER+]: Secondary | ICD-10-CM

## 2023-05-20 MED ORDER — GADOPICLENOL 0.5 MMOL/ML IV SOLN
6.0000 mL | Freq: Once | INTRAVENOUS | Status: AC | PRN
  Administered 2023-05-20: 6 mL via INTRAVENOUS
